# Patient Record
Sex: Female | Born: 1985 | Hispanic: No | Marital: Married | Smoking: Former smoker
Health system: Southern US, Community
[De-identification: ages and names within clinical notes are randomized; demographics above are authoritative.]

## PROBLEM LIST (undated history)

## (undated) DIAGNOSIS — Z789 Other specified health status: Secondary | ICD-10-CM

## (undated) DIAGNOSIS — F988 Other specified behavioral and emotional disorders with onset usually occurring in childhood and adolescence: Secondary | ICD-10-CM

## (undated) DIAGNOSIS — D649 Anemia, unspecified: Secondary | ICD-10-CM

## (undated) HISTORY — DX: Anemia, unspecified: D64.9

## (undated) HISTORY — PX: NO PAST SURGERIES: SHX2092

## (undated) HISTORY — DX: Other specified behavioral and emotional disorders with onset usually occurring in childhood and adolescence: F98.8

---
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
## 2008-01-07 ENCOUNTER — Other Ambulatory Visit: Admission: RE | Admit: 2008-01-07 | Discharge: 2008-01-07 | Payer: Self-pay | Admitting: Obstetrics and Gynecology

## 2009-10-24 ENCOUNTER — Other Ambulatory Visit: Admission: RE | Admit: 2009-10-24 | Discharge: 2009-10-24 | Payer: Self-pay | Admitting: Obstetrics and Gynecology

## 2010-02-04 ENCOUNTER — Ambulatory Visit: Payer: Self-pay | Admitting: Advanced Practice Midwife

## 2010-02-04 ENCOUNTER — Inpatient Hospital Stay (HOSPITAL_COMMUNITY): Admission: AD | Admit: 2010-02-04 | Discharge: 2010-02-06 | Payer: Self-pay | Admitting: Obstetrics and Gynecology

## 2010-11-20 LAB — RAPID URINE DRUG SCREEN, HOSP PERFORMED
Benzodiazepines: NOT DETECTED
Cocaine: NOT DETECTED

## 2010-11-20 LAB — CBC
HCT: 30.3 % — ABNORMAL LOW (ref 36.0–46.0)
Hemoglobin: 10.2 g/dL — ABNORMAL LOW (ref 12.0–15.0)
MCV: 75 fL — ABNORMAL LOW (ref 78.0–100.0)
WBC: 14.8 10*3/uL — ABNORMAL HIGH (ref 4.0–10.5)

## 2010-11-20 LAB — RPR: RPR Ser Ql: NONREACTIVE

## 2013-09-03 NOTE — L&D Delivery Note (Signed)
Delivery Note At 9:42 PM a viable female was delivered via Vaginal, Spontaneous Delivery (Presentation: Right Occiput Anterior) through loose nuchal x 1- delivered through and reduced immediately after birth. APGAR: pending at time of note; infant vigorous w/ lusty cry. Infant placed directly on mom's abdomen for bonding/skin-to-skin. Cord allowed to stop pulsing, and was clamped x 2, and cut by pt's sister.  Weight: pending  .   Placenta status: Intact, Spontaneous.  Cord: 3 vessels with the following complications: None.    Anesthesia: Epidural  Episiotomy: None Lacerations: None Suture Repair: n/a Est. Blood Loss (mL):  156ml  Mom to postpartum.  Baby to Couplet care / Skin to Skin. Plans to bottlefeed, probably nexplanon for contraception, circ at FT  Tawnya Crook 08/21/2014, 9:59 PM

## 2014-01-15 ENCOUNTER — Inpatient Hospital Stay (HOSPITAL_COMMUNITY)
Admission: AD | Admit: 2014-01-15 | Discharge: 2014-01-16 | Disposition: A | Discharge status: Home / Self Care | Payer: Medicaid Other | Source: Ambulatory Visit | Attending: Obstetrics and Gynecology | Admitting: Obstetrics and Gynecology

## 2014-01-15 ENCOUNTER — Encounter (HOSPITAL_COMMUNITY): Payer: Self-pay | Admitting: *Deleted

## 2014-01-15 DIAGNOSIS — O418X10 Other specified disorders of amniotic fluid and membranes, first trimester, not applicable or unspecified: Secondary | ICD-10-CM

## 2014-01-15 DIAGNOSIS — Z833 Family history of diabetes mellitus: Secondary | ICD-10-CM | POA: Insufficient documentation

## 2014-01-15 DIAGNOSIS — M545 Low back pain, unspecified: Secondary | ICD-10-CM | POA: Insufficient documentation

## 2014-01-15 DIAGNOSIS — O2390 Unspecified genitourinary tract infection in pregnancy, unspecified trimester: Secondary | ICD-10-CM | POA: Insufficient documentation

## 2014-01-15 DIAGNOSIS — O468X1 Other antepartum hemorrhage, first trimester: Secondary | ICD-10-CM

## 2014-01-15 DIAGNOSIS — O208 Other hemorrhage in early pregnancy: Secondary | ICD-10-CM | POA: Diagnosis not present

## 2014-01-15 DIAGNOSIS — N390 Urinary tract infection, site not specified: Secondary | ICD-10-CM

## 2014-01-15 DIAGNOSIS — O99891 Other specified diseases and conditions complicating pregnancy: Secondary | ICD-10-CM | POA: Diagnosis not present

## 2014-01-15 DIAGNOSIS — O26899 Other specified pregnancy related conditions, unspecified trimester: Secondary | ICD-10-CM

## 2014-01-15 DIAGNOSIS — R109 Unspecified abdominal pain: Secondary | ICD-10-CM | POA: Diagnosis present

## 2014-01-15 DIAGNOSIS — O9989 Other specified diseases and conditions complicating pregnancy, childbirth and the puerperium: Secondary | ICD-10-CM

## 2014-01-15 HISTORY — DX: Other specified health status: Z78.9

## 2014-01-15 LAB — URINALYSIS, ROUTINE W REFLEX MICROSCOPIC
Bilirubin Urine: NEGATIVE
Glucose, UA: NEGATIVE mg/dL
Hgb urine dipstick: NEGATIVE
Ketones, ur: NEGATIVE mg/dL
NITRITE: POSITIVE — AB
PH: 6 (ref 5.0–8.0)
Protein, ur: NEGATIVE mg/dL
SPECIFIC GRAVITY, URINE: 1.025 (ref 1.005–1.030)
Urobilinogen, UA: 1 mg/dL (ref 0.0–1.0)

## 2014-01-15 LAB — CBC
HEMATOCRIT: 35.1 % — AB (ref 36.0–46.0)
Hemoglobin: 11.6 g/dL — ABNORMAL LOW (ref 12.0–15.0)
MCH: 27.6 pg (ref 26.0–34.0)
MCHC: 33 g/dL (ref 30.0–36.0)
MCV: 83.6 fL (ref 78.0–100.0)
Platelets: 247 10*3/uL (ref 150–400)
RBC: 4.2 MIL/uL (ref 3.87–5.11)
RDW: 13.3 % (ref 11.5–15.5)
WBC: 6.6 10*3/uL (ref 4.0–10.5)

## 2014-01-15 LAB — WET PREP, GENITAL
Trich, Wet Prep: NONE SEEN
Yeast Wet Prep HPF POC: NONE SEEN

## 2014-01-15 LAB — URINE MICROSCOPIC-ADD ON

## 2014-01-15 LAB — HCG, QUANTITATIVE, PREGNANCY: hCG, Beta Chain, Quant, S: 49353 m[IU]/mL — ABNORMAL HIGH (ref <5)

## 2014-01-15 LAB — POCT PREGNANCY, URINE: Preg Test, Ur: POSITIVE — AB

## 2014-01-15 MED ORDER — FAMOTIDINE IN NACL 20-0.9 MG/50ML-% IV SOLN
20.0000 mg | Freq: Once | INTRAVENOUS | Status: AC
  Administered 2014-01-15: 20 mg via INTRAVENOUS
  Filled 2014-01-15: qty 50

## 2014-01-15 MED ORDER — PROMETHAZINE HCL 25 MG/ML IJ SOLN
25.0000 mg | Freq: Once | INTRAVENOUS | Status: AC
  Administered 2014-01-15: 25 mg via INTRAVENOUS
  Filled 2014-01-15: qty 1

## 2014-01-15 NOTE — MAU Note (Signed)
Had a lot of vomiting for one day last wk and took preg test and was positive. Had some pain in upper abd that day. Some nausea but no further vomiting. Last couple days had sharp pains in upper abdomen and some "cold chills" coupled times last few days.

## 2014-01-15 NOTE — MAU Provider Note (Signed)
History     CSN: [id number]  Arrival date and time: 01/15/14 2126   First Provider Initiated Contact with Patient 01/15/14 2203      Chief Complaint  Patient presents with  . Abdominal Pain   HPI Ms. NYELLI SAMARA is a 28 y.o. [phone] at [redacted]w[redacted]d by LMP who presents to MAU today with complaint of 2 days of abdominal pain. The patient states that the pain started in the upper abdomen as occasional sharp pains. She states that the pain is more often after eating. It has become more frequent. She is not having any pain right now. She is now also having lower abdominal pains occasionally. She denies fever, but has had chills. She is also having mild low back pain. She denies vaginal bleeding, but has had some clear, mucus discharge and noted a brown discharge last week. She has had nausea most days, but only one episode of vomiting recently. She denies UTI symptoms.    OB History   Grav Para Term Preterm Abortions TAB SAB Ect Mult Living   4 2 2       2       Past Medical History  Diagnosis Date  . Medical history non-contributory     Past Surgical History  Procedure Laterality Date  . No past surgeries      Family History  Problem Relation Age of Onset  . Diabetes Mother     History  Substance Use Topics  . Smoking status: Never Smoker   . Smokeless tobacco: Not on file  . Alcohol Use: No    Allergies: No Known Allergies  No prescriptions prior to admission    Review of Systems  Constitutional: Positive for chills. Negative for fever and malaise/fatigue.  Gastrointestinal: Positive for nausea and abdominal pain. Negative for vomiting, diarrhea and constipation.  Genitourinary: Negative for dysuria, urgency and frequency.       Neg - vaginal bleeding + discharge   Physical Exam   Blood pressure 114/67, pulse 73, temperature 98.7 F (37.1 C), resp. rate 20, height 5' (1.524 m), weight 126 lb 12.8 oz (57.516 kg), last menstrual period 11/02/2013.  Physical Exam   Constitutional: She is oriented to person, place, and time. She appears well-developed and well-nourished. No distress.  HENT:  Head: Normocephalic and atraumatic.  Cardiovascular: Normal rate, regular rhythm and normal heart sounds.   Respiratory: Effort normal and breath sounds normal. No respiratory distress.  GI: Soft. Bowel sounds are normal. She exhibits no distension and no mass. There is tenderness (mild diffuse tenderness to palpation worse in the epigastric region). There is no rebound, no guarding and no CVA tenderness.  Genitourinary: Uterus is enlarged (slightly). Uterus is not tender. Cervix exhibits no motion tenderness, no discharge and no friability. Right adnexum displays no mass and no tenderness. Left adnexum displays no mass and no tenderness. No bleeding around the vagina. Vaginal discharge (moderate amount of thin, white discharge noted) found.  Neurological: She is alert and oriented to person, place, and time.  Skin: Skin is warm and dry. No erythema.  Psychiatric: She has a normal mood and affect.   Results for orders placed during the hospital encounter of 01/15/14 (from the past 24 hour(s))  URINALYSIS, ROUTINE W REFLEX MICROSCOPIC     Status: Abnormal   Collection Time    01/15/14  9:41 PM      Result Value Ref Range   Color, Urine YELLOW  YELLOW   APPearance CLEAR  CLEAR  Specific Gravity, Urine 1.025  1.005 - 1.030   pH 6.0  5.0 - 8.0   Glucose, UA NEGATIVE  NEGATIVE mg/dL   Hgb urine dipstick NEGATIVE  NEGATIVE   Bilirubin Urine NEGATIVE  NEGATIVE   Ketones, ur NEGATIVE  NEGATIVE mg/dL   Protein, ur NEGATIVE  NEGATIVE mg/dL   Urobilinogen, UA 1.0  0.0 - 1.0 mg/dL   Nitrite POSITIVE (*) NEGATIVE   Leukocytes, UA MODERATE (*) NEGATIVE  URINE MICROSCOPIC-ADD ON     Status: Abnormal   Collection Time    01/15/14  9:41 PM      Result Value Ref Range   Squamous Epithelial / LPF FEW (*) RARE   WBC, UA 7-10  <3 WBC/hpf   RBC / HPF 0-2  <3 RBC/hpf    Bacteria, UA MANY (*) RARE  POCT PREGNANCY, URINE     Status: Abnormal   Collection Time    01/15/14  9:55 PM      Result Value Ref Range   Preg Test, Ur POSITIVE (*) NEGATIVE  CBC     Status: Abnormal   Collection Time    01/15/14 10:35 PM      Result Value Ref Range   WBC 6.6  4.0 - 10.5 K/uL   RBC 4.20  3.87 - 5.11 MIL/uL   Hemoglobin 11.6 (*) 12.0 - 15.0 g/dL   HCT 35.1 (*) 36.0 - 46.0 %   MCV 83.6  78.0 - 100.0 fL   MCH 27.6  26.0 - 34.0 pg   MCHC 33.0  30.0 - 36.0 g/dL   RDW 13.3  11.5 - 15.5 %   Platelets 247  150 - 400 K/uL  ABO/RH     Status: None   Collection Time    01/15/14 10:35 PM      Result Value Ref Range   ABO/RH(D) O POS    HCG, QUANTITATIVE, PREGNANCY     Status: Abnormal   Collection Time    01/15/14 10:35 PM      Result Value Ref Range   hCG, Beta Chain, Quant, S [id number] (*) <5 mIU/mL  WET PREP, GENITAL     Status: Abnormal   Collection Time    01/15/14 11:10 PM      Result Value Ref Range   Yeast Wet Prep HPF POC NONE SEEN  NONE SEEN   Trich, Wet Prep NONE SEEN  NONE SEEN   Clue Cells Wet Prep HPF POC FEW (*) NONE SEEN   WBC, Wet Prep HPF POC MODERATE (*) NONE SEEN   US Ob Comp Less 14 Wks  01/16/2014   CLINICAL DATA:  Lower abdominal pain and vaginal spotting. Ten weeks and 5 days pregnant by last menstrual period. Quantitative beta HCG [id number].  EXAM: OBSTETRIC <14 WK [place] AND TRANSVAGINAL OB US  TECHNIQUE: Both transabdominal and transvaginal ultrasound examinations were performed for complete evaluation of the gestation as well as the maternal uterus, adnexal regions, and pelvic cul-de-sac. Transvaginal technique was performed to assess early pregnancy.  COMPARISON:  None.  FINDINGS: Intrauterine gestational sac: Visualized/normal in shape.  Yolk sac:  Visualized/normal in shape  Embryo:  Visualized  Cardiac Activity: Visualized  Heart Rate:  154 bpm  CRL:   10.2  mm   7 w 1 d                  [place] EDC: 09/03/2014  Maternal uterus/adnexae: Small subchorionic  hemorrhage. Normal appearing maternal ovaries with a left corpus luteum. No free  peritoneal fluid.  IMPRESSION: 1. Single live intrauterine gestation with an estimated gestational age of [redacted] weeks 1 day. 2. Small subchorionic hemorrhage.   Electronically Signed   By: Enrique Sack M.D.   On: 01/16/2014 02:02   US Ob Transvaginal  01/16/2014   CLINICAL DATA:  Lower abdominal pain and vaginal spotting. Ten weeks and 5 days pregnant by last menstrual period. Quantitative beta HCG [id number].  EXAM: OBSTETRIC <14 WK [place] AND TRANSVAGINAL OB US  TECHNIQUE: Both transabdominal and transvaginal ultrasound examinations were performed for complete evaluation of the gestation as well as the maternal uterus, adnexal regions, and pelvic cul-de-sac. Transvaginal technique was performed to assess early pregnancy.  COMPARISON:  None.  FINDINGS: Intrauterine gestational sac: Visualized/normal in shape.  Yolk sac:  Visualized/normal in shape  Embryo:  Visualized  Cardiac Activity: Visualized  Heart Rate:  154 bpm  CRL:   10.2  mm   7 w 1 d                  [place] EDC: 09/03/2014  Maternal uterus/adnexae: Small subchorionic hemorrhage. Normal appearing maternal ovaries with a left corpus luteum. No free peritoneal fluid.  IMPRESSION: 1. Single live intrauterine gestation with an estimated gestational age of [redacted] weeks 1 day. 2. Small subchorionic hemorrhage.   Electronically Signed   By: Enrique Sack M.D.   On: 01/16/2014 02:02    MAU Course  Procedures None  MDM +UPT UA, wet prep, GC/Chlamydia, CBC, ABO/Rh, quant hCG and [place] today Pepcid given in MAU  Assessment and Plan  A: [facility] at [redacted]w[redacted]d with normal cardiac activity UTI Small subchorionic hemorrhage  P: Discharge home Rx for Phenergan and Keflex given to patient Warning signs for pyelonephritis discussed Discussed use of Unisom and B6 for N/V First trimester warning signs discussed Patient advised to keep appointment with [facility] for routine prenatal care Patient may return to  MAU as needed or if her condition were to change or worsen  Farris Has, PA-C  01/16/2014, 3:33 AM

## 2014-01-16 ENCOUNTER — Inpatient Hospital Stay (HOSPITAL_COMMUNITY): Payer: Medicaid Other

## 2014-01-16 DIAGNOSIS — N390 Urinary tract infection, site not specified: Secondary | ICD-10-CM

## 2014-01-16 LAB — ABO/RH: ABO/RH(D): O POS

## 2014-01-16 MED ORDER — PROMETHAZINE HCL 12.5 MG PO TABS: 12.5000 mg | ORAL_TABLET | Freq: Four times a day (QID) | ORAL | Status: DC | PRN

## 2014-01-16 MED ORDER — CEPHALEXIN 500 MG PO CAPS: 500.0000 mg | ORAL_CAPSULE | Freq: Four times a day (QID) | ORAL | Status: DC

## 2014-01-16 NOTE — Discharge Instructions (Signed)
Subchorionic Hematoma A subchorionic hematoma is a gathering of blood between the outer wall of the placenta and the inner wall of the womb (uterus). The placenta is the organ that connects the fetus to the wall of the uterus. The placenta performs the feeding, breathing (oxygen to the fetus), and waste removal (excretory work) of the fetus.  Subchorionic hematoma is the most common abnormality found on a result from ultrasonography done during the first trimester or early second trimester of pregnancy. If there has been little or no vaginal bleeding, early small hematomas usually shrink on their own and do not affect your baby or pregnancy. The blood is gradually absorbed over 1 2 weeks. When bleeding starts later in pregnancy or the hematoma is larger or occurs in an older pregnant woman, the outcome may not be as good. Larger hematomas may get bigger, which increases the chances for miscarriage. Subchorionic hematoma also increases the risk of premature detachment of the placenta from the uterus, preterm (premature) labor, and stillbirth. HOME CARE INSTRUCTIONS   Stay on bed rest if your health care provider recommends this. Although bed rest will not prevent more bleeding or prevent a miscarriage, your health care provider may recommend bed rest until you are advised otherwise.  Avoid heavy lifting (more than 10 lb [4.5 kg]), exercise, sexual intercourse, or douching as directed by your health care provider.  Keep track of the number of pads you use each day and how soaked (saturated) they are. Write down this information.  Do not use tampons.  Keep all follow-up appointments as directed by your health care provider. Your health care provider may ask you to have follow-up blood tests or ultrasound tests or both. SEEK IMMEDIATE MEDICAL CARE IF:   You have severe cramps in your stomach, back, abdomen, or pelvis.  You have a fever.  You pass large clots or tissue. Save any tissue for your health  care provider to look at.  Your bleeding increases or you become lightheaded, feel weak, or have fainting episodes. Document Released: 12/05/2006 Document Revised: 06/10/2013 Document Reviewed: 03/19/2013 [facility] Patient Information 2014 [place], [place]. Urinary Tract Infection A urinary tract infection (UTI) can occur any place along the urinary tract. The tract includes the kidneys, ureters, bladder, and urethra. A type of germ called bacteria often causes a UTI. UTIs are often helped with antibiotic medicine.  HOME CARE   If given, take antibiotics as told by your doctor. Finish them even if you start to feel better.  Drink enough fluids to keep your pee (urine) clear or pale yellow.  Avoid tea, drinks with caffeine, and bubbly (carbonated) drinks.  Pee often. Avoid holding your pee in for a long time.  Pee before and after having sex (intercourse).  Wipe from front to back after you poop (bowel movement) if you are a woman. Use each tissue only once. GET HELP RIGHT AWAY IF:   You have back pain.  You have lower belly (abdominal) pain.  You have chills.  You feel sick to your stomach (nauseous).  You throw up (vomit).  Your burning or discomfort with peeing does not go away.  You have a fever.  Your symptoms are not better in 3 days. MAKE SURE YOU:   Understand these instructions.  Will watch your condition.  Will get help right away if you are not doing well or get worse. Document Released: 02/06/2008 Document Revised: 05/14/2012 Document Reviewed: 03/20/2012 [facility] Patient Information 2014 [place], [place].

## 2014-01-16 NOTE — Progress Notes (Signed)
Faith Allen Either PA in earlier to discuss test results and d/c plan. Written and verbal d/c instructions given and understanding voiced

## 2014-01-17 NOTE — MAU Provider Note (Signed)
Attestation of Attending Supervision of Advanced Practitioner: Evaluation and management procedures were performed by the PA/NP/CNM/OB Fellow under my supervision/collaboration. Chart reviewed and agree with management and plan.  Jonnie Kind 01/17/2014 7:05 PM

## 2014-01-27 ENCOUNTER — Other Ambulatory Visit: Payer: Self-pay | Admitting: Obstetrics and Gynecology

## 2014-01-27 DIAGNOSIS — O3680X0 Pregnancy with inconclusive fetal viability, not applicable or unspecified: Secondary | ICD-10-CM

## 2014-01-29 ENCOUNTER — Other Ambulatory Visit: Payer: Self-pay | Admitting: Obstetrics and Gynecology

## 2014-01-29 ENCOUNTER — Ambulatory Visit (INDEPENDENT_AMBULATORY_CARE_PROVIDER_SITE_OTHER): Payer: Medicaid Other

## 2014-01-29 ENCOUNTER — Other Ambulatory Visit: Payer: Self-pay | Admitting: *Deleted

## 2014-01-29 DIAGNOSIS — O368990 Maternal care for other specified fetal problems, unspecified trimester, not applicable or unspecified: Secondary | ICD-10-CM

## 2014-01-29 DIAGNOSIS — O3680X0 Pregnancy with inconclusive fetal viability, not applicable or unspecified: Secondary | ICD-10-CM

## 2014-01-29 DIAGNOSIS — O43899 Other placental disorders, unspecified trimester: Secondary | ICD-10-CM

## 2014-01-29 MED ORDER — DOXYLAMINE-PYRIDOXINE 10-10 MG PO TBEC
10.0000 mg | DELAYED_RELEASE_TABLET | ORAL | Status: DC
Start: 2014-01-29 — End: 2014-03-03

## 2014-01-29 NOTE — Progress Notes (Signed)
U/S(9+0wks)-single IUP with +FCA noted, FHR-176bpm, cx appears closed (3.4cm), bilateral adnexa appears wnl, CRL c/w dates, small [facility] remains (11 x 85mm)

## 2014-02-04 ENCOUNTER — Encounter: Payer: Medicaid Other | Admitting: Advanced Practice Midwife

## 2014-02-08 ENCOUNTER — Encounter: Payer: Medicaid Other | Admitting: Women's Health

## 2014-02-16 ENCOUNTER — Encounter: Payer: Self-pay | Admitting: Women's Health

## 2014-02-24 ENCOUNTER — Encounter: Payer: Self-pay | Admitting: Women's Health

## 2014-03-03 ENCOUNTER — Encounter: Payer: Self-pay | Admitting: Women's Health

## 2014-03-03 ENCOUNTER — Other Ambulatory Visit (HOSPITAL_COMMUNITY)
Admission: RE | Admit: 2014-03-03 | Discharge: 2014-03-03 | Disposition: A | Discharge status: Home / Self Care | Payer: Medicaid Other | Source: Ambulatory Visit | Attending: Advanced Practice Midwife | Admitting: Advanced Practice Midwife

## 2014-03-03 ENCOUNTER — Ambulatory Visit (INDEPENDENT_AMBULATORY_CARE_PROVIDER_SITE_OTHER): Payer: Medicaid Other

## 2014-03-03 ENCOUNTER — Ambulatory Visit (INDEPENDENT_AMBULATORY_CARE_PROVIDER_SITE_OTHER): Payer: Medicaid Other | Admitting: Women's Health

## 2014-03-03 ENCOUNTER — Other Ambulatory Visit: Payer: Self-pay | Admitting: Women's Health

## 2014-03-03 VITALS — BP 90/62 | Wt 117.0 lb

## 2014-03-03 DIAGNOSIS — D259 Leiomyoma of uterus, unspecified: Secondary | ICD-10-CM | POA: Insufficient documentation

## 2014-03-03 DIAGNOSIS — Z3482 Encounter for supervision of other normal pregnancy, second trimester: Secondary | ICD-10-CM

## 2014-03-03 DIAGNOSIS — Z01419 Encounter for gynecological examination (general) (routine) without abnormal findings: Secondary | ICD-10-CM | POA: Diagnosis not present

## 2014-03-03 DIAGNOSIS — Z36 Encounter for antenatal screening of mother: Secondary | ICD-10-CM

## 2014-03-03 DIAGNOSIS — Z3481 Encounter for supervision of other normal pregnancy, first trimester: Secondary | ICD-10-CM

## 2014-03-03 DIAGNOSIS — Z3480 Encounter for supervision of other normal pregnancy, unspecified trimester: Secondary | ICD-10-CM

## 2014-03-03 DIAGNOSIS — O3410 Maternal care for benign tumor of corpus uteri, unspecified trimester: Secondary | ICD-10-CM

## 2014-03-03 DIAGNOSIS — Z1389 Encounter for screening for other disorder: Secondary | ICD-10-CM

## 2014-03-03 DIAGNOSIS — Z113 Encounter for screening for infections with a predominantly sexual mode of transmission: Secondary | ICD-10-CM | POA: Diagnosis present

## 2014-03-03 DIAGNOSIS — Z331 Pregnant state, incidental: Secondary | ICD-10-CM

## 2014-03-03 LAB — CBC
HEMATOCRIT: 35.1 % — AB (ref 36.0–46.0)
HEMOGLOBIN: 12 g/dL (ref 12.0–15.0)
MCH: 27.9 pg (ref 26.0–34.0)
MCHC: 34.2 g/dL (ref 30.0–36.0)
MCV: 81.6 fL (ref 78.0–100.0)
Platelets: 231 10*3/uL (ref 150–400)
RBC: 4.3 MIL/uL (ref 3.87–5.11)
RDW: 14.4 % (ref 11.5–15.5)
WBC: 5.5 10*3/uL (ref 4.0–10.5)

## 2014-03-03 NOTE — Patient Instructions (Addendum)
Nausea & Vomiting  Have saltine crackers or pretzels by your bed and eat a few bites before you raise your head out of bed in the morning  Eat small frequent meals throughout the day instead of large meals  Drink plenty of fluids throughout the day to stay hydrated, just don't drink a lot of fluids with your meals.  This can make your stomach fill up faster making you feel sick  Do not brush your teeth right after you eat  Products with real ginger are good for nausea, like ginger ale and ginger hard candy Make sure it says made with real ginger!  Sucking on sour candy like lemon heads is also good for nausea  If your prenatal vitamins make you nauseated, take them at night so you will sleep through the nausea  You can also try sea bands (bracelets that go on your wrist) that you can purchase to help with nausea  If you feel like you need medicine for the nausea & vomiting please let us know  If you are unable to keep any fluids or food down please let us know or go to [facility] to get checked out   Constipation  Drink plenty of fluid, preferably water, throughout the day  Eat foods high in fiber such as fruits, vegetables, and grains  Exercise, such as walking, is a good way to keep your bowels regular  Drink warm fluids, especially warm prune juice, or decaf coffee  Eat a 1/2 cup of real oatmeal (not instant), 1/2 cup applesauce, and 1/2-1 cup warm prune juice every day  If needed, you may take Colace (docusate sodium) stool softener once or twice a day to help keep the stool soft. If you are pregnant, wait until you are out of your first trimester (12-14 weeks of pregnancy)  If you still are having problems with constipation, you may take Miralax once daily as needed to help keep your bowels regular.  If you are pregnant, wait until you are out of your first trimester (12-14 weeks of pregnancy)  Pregnancy - First Trimester During sexual intercourse, millions of sperm  go into the vagina. Only 1 sperm will penetrate and fertilize the female egg while it is in the Fallopian tube. One week later, the fertilized egg implants into the wall of the uterus. An embryo begins to develop into a baby. At 6 to 8 weeks, the eyes and face are formed and the heartbeat can be seen on ultrasound. At the end of 12 weeks (first trimester), all the baby's organs are formed. Now that you are pregnant, you will want to do everything you can to have a healthy baby. Two of the most important things are to get good prenatal care and follow your caregiver's instructions. Prenatal care is all the medical care you receive before the baby's birth. It is given to prevent, find, and treat problems during the pregnancy and childbirth. PRENATAL EXAMS  During prenatal visits, your weight, blood pressure, and urine are checked. This is done to make sure you are healthy and progressing normally during the pregnancy.  A pregnant woman should gain 25 to 35 pounds during the pregnancy. However, if you are overweight or underweight, your caregiver will advise you regarding your weight.  Your caregiver will ask and answer questions for you.  Blood work, cervical cultures, other necessary tests, and a Pap test are done during your prenatal exams. These tests are done to check on your health and the probable  health of your baby. Tests are strongly recommended and done for HIV with your permission. This is the virus that causes AIDS. These tests are done because medicines can be given to help prevent your baby from being born with this infection should you have been infected without knowing it. Blood work is also used to find out your blood type, previous infections, and follow your blood levels (hemoglobin).  Low hemoglobin (anemia) is common during pregnancy. Iron and vitamins are given to help prevent this. Later in the pregnancy, blood tests for diabetes will be done along with any other tests if any problems  develop.  You may need other tests to make sure you and the baby are doing well. CHANGES DURING THE FIRST TRIMESTER  Your body goes through many changes during pregnancy. They vary from person to person. Talk to your caregiver about changes you notice and are concerned about. Changes can include:  Your menstrual period stops.  The egg and sperm carry the genes that determine what you look like. Genes from you and your partner are forming a baby. The female genes determine whether the baby is a boy or a girl.  Your body increases in girth and you may feel bloated.  Feeling sick to your stomach (nauseous) and throwing up (vomiting). If the vomiting is uncontrollable, call your caregiver.  Your breasts will begin to enlarge and become tender.  Your nipples may stick out more and become darker.  The need to urinate more. Painful urination may mean you have a bladder infection.  Tiring easily.  Loss of appetite.  Cravings for certain kinds of food.  At first, you may gain or lose a couple of pounds.  You may have changes in your emotions from day to day (excited to be pregnant or concerned something may go wrong with the pregnancy and baby).  You may have more vivid and strange dreams. HOME CARE INSTRUCTIONS   It is very important to avoid all smoking, alcohol and non-prescribed drugs during your pregnancy. These affect the formation and growth of the baby. Avoid chemicals while pregnant to ensure the delivery of a healthy infant.  Start your prenatal visits by the 12th week of pregnancy. They are usually scheduled monthly at first, then more often in the last 2 months before delivery. Keep your caregiver's appointments. Follow your caregiver's instructions regarding medicine use, blood and lab tests, exercise, and diet.  During pregnancy, you are providing food for you and your baby. Eat regular, well-balanced meals. Choose foods such as meat, fish, milk and other low fat dairy  products, vegetables, fruits, and whole-grain breads and cereals. Your caregiver will tell you of the ideal weight gain.  You can help morning sickness by keeping soda crackers at the bedside. Eat a couple before arising in the morning. You may want to use the crackers without salt on them.  Eating 4 to 5 small meals rather than 3 large meals a day also may help the nausea and vomiting.  Drinking liquids between meals instead of during meals also seems to help nausea and vomiting.  A physical sexual relationship may be continued throughout pregnancy if there are no other problems. Problems may be early (premature) leaking of amniotic fluid from the membranes, vaginal bleeding, or belly (abdominal) pain.  Exercise regularly if there are no restrictions. Check with your caregiver or physical therapist if you are unsure of the safety of some of your exercises. Greater weight gain will occur in the last 2  trimesters of pregnancy. Exercising will help:  Control your weight.  Keep you in shape.  Prepare you for labor and delivery.  Help you lose your pregnancy weight after you deliver your baby.  Wear a good support or jogging bra for breast tenderness during pregnancy. This may help if worn during sleep too.  Ask when prenatal classes are available. Begin classes when they are offered.  Do not use hot tubs, steam rooms, or saunas.  Wear your seat belt when driving. This protects you and your baby if you are in an accident.  Avoid raw meat, uncooked cheese, cat litter boxes, and soil used by cats throughout the pregnancy. These carry germs that can cause birth defects in the baby.  The first trimester is a good time to visit your dentist for your dental health. Getting your teeth cleaned is okay. Use a softer toothbrush and brush gently during pregnancy.  Ask for help if you have financial, counseling, or nutritional needs during pregnancy. Your caregiver will be able to offer counseling  for these needs as well as refer you for other special needs.  Do not take any medicines or herbs unless told by your caregiver.  Inform your caregiver if there is any mental or physical domestic violence.  Make a list of emergency phone numbers of family, friends, hospital, and police and fire departments.  Write down your questions. Take them to your prenatal visit.  Do not douche.  Do not cross your legs.  If you have to stand for long periods of time, rotate you feet or take small steps in a circle.  You may have more vaginal secretions that may require a sanitary pad. Do not use tampons or scented sanitary pads. MEDICINES AND DRUG USE IN PREGNANCY  Take prenatal vitamins as directed. The vitamin should contain 1 milligram of folic acid. Keep all vitamins out of reach of children. Only a couple vitamins or tablets containing iron may be fatal to a baby or young child when ingested.  Avoid use of all medicines, including herbs, over-the-counter medicines, not prescribed or suggested by your caregiver. Only take over-the-counter or prescription medicines for pain, discomfort, or fever as directed by your caregiver. Do not use aspirin, ibuprofen, or naproxen unless directed by your caregiver.  Let your caregiver also know about herbs you may be using.  Alcohol is related to a number of birth defects. This includes fetal alcohol syndrome. All alcohol, in any form, should be avoided completely. Smoking will cause low birth rate and premature babies.  Street or illegal drugs are very harmful to the baby. They are absolutely forbidden. A baby born to an addicted mother will be addicted at birth. The baby will go through the same withdrawal an adult does.  Let your caregiver know about any medicines that you have to take and for what reason you take them. SEEK MEDICAL CARE IF:  You have any concerns or worries during your pregnancy. It is better to call with your questions if you feel they  cannot wait, rather than worry about them. SEEK IMMEDIATE MEDICAL CARE IF:   An unexplained oral temperature above 102 F (38.9 C) develops, or as your caregiver suggests.  You have leaking of fluid from the vagina (birth canal). If leaking membranes are suspected, take your temperature and inform your caregiver of this when you call.  There is vaginal spotting or bleeding. Notify your caregiver of the amount and how many pads are used.  You develop a  bad smelling vaginal discharge with a change in the color.  You continue to feel sick to your stomach (nauseated) and have no relief from remedies suggested. You vomit blood or coffee ground-like materials.  You lose more than 2 pounds of weight in 1 week.  You gain more than 2 pounds of weight in 1 week and you notice swelling of your face, hands, feet, or legs.  You gain 5 pounds or more in 1 week (even if you do not have swelling of your hands, face, legs, or feet).  You get exposed to [place] measles and have never had them.  You are exposed to fifth disease or chickenpox.  You develop belly (abdominal) pain. Round ligament discomfort is a common non-cancerous (benign) cause of abdominal pain in pregnancy. Your caregiver still must evaluate this.  You develop headache, fever, diarrhea, pain with urination, or shortness of breath.  You fall or are in a car accident or have any kind of trauma.  There is mental or physical violence in your home. Document Released: 08/14/2001 Document Revised: 05/14/2012 Document Reviewed: 06/30/2013 [facility] Patient Information 2015 [place], [place]. This information is not intended to replace advice given to you by your health care provider. Make sure you discuss any questions you have with your health care provider.    Uterine Fibroid A uterine fibroid is a growth (tumor) that occurs in your uterus. This type of tumor is not cancerous and does not spread out of the uterus. You can have one or many  fibroids. Fibroids can vary in size, weight, and where they grow in the uterus. Some can become quite large. Most fibroids do not require medical treatment, but some can cause pain or heavy bleeding during and between periods. CAUSES  A fibroid is the result of a single uterine cell that keeps growing (unregulated), which is different than most cells in the human body. Most cells have a control mechanism that keeps them from reproducing without control.  SIGNS AND SYMPTOMS   Bleeding.  Pelvic pain and pressure.  Bladder problems due to the size of the fibroid.  Infertility and miscarriages depending on the size and location of the fibroid. DIAGNOSIS  Uterine fibroids are diagnosed through a physical exam. Your health care provider may feel the lumpy tumors during a pelvic exam. Ultrasonography may be done to get information regarding size, location, and number of tumors.  TREATMENT   Your health care provider may recommend watchful waiting. This involves getting the fibroid checked by your health care provider to see if it grows or shrinks.   Hormone treatment or an intrauterine device (IUD) may be prescribed.   Surgery may be needed to remove the fibroids (myomectomy) or the uterus (hysterectomy). This depends on your situation. When fibroids interfere with fertility and a woman wants to become pregnant, a health care provider may recommend having the fibroids removed.  [place] care depends on how you were treated. In general:   Keep all follow-up appointments with your health care provider.   Only take over-the-counter or prescription medicines as directed by your health care provider. If you were prescribed a hormone treatment, take the hormone medicines exactly as directed. Do not take aspirin. It can cause bleeding.   Talk to your health care provider about taking iron pills.  If your periods are troublesome but not so heavy, lie down with your feet raised  slightly above your heart. Place cold packs on your lower abdomen.   If  your periods are heavy, write down the number of pads or tampons you use per month. Bring this information to your health care provider.   Include green vegetables in your diet.  SEEK IMMEDIATE MEDICAL CARE IF:  You have pelvic pain or cramps not controlled with medicines.   You have a sudden increase in pelvic pain.   You have an increase in bleeding between and during periods.   You have excessive periods and soak tampons or pads in a half hour or less.  You feel lightheaded or have fainting episodes. Document Released: 08/17/2000 Document Revised: 06/10/2013 Document Reviewed: 03/19/2013 [facility] Patient Information 2015 [place], [place]. This information is not intended to replace advice given to you by your health care provider. Make sure you discuss any questions you have with your health care provider.

## 2014-03-03 NOTE — Progress Notes (Signed)
  Subjective:  Faith Allen is a 28 y.o. G17P2002 Hispanic female at [redacted]w[redacted]d by 7.1wk u/s, being seen today for her first obstetrical visit.  Her obstetrical history is significant for term uncomplicated svd x 2.  Pregnancy history fully reviewed.  Patient reports n/v- diclegis made her drowsy, doesn't want any other meds. Denies vb, cramping, uti s/s, abnormal/malodorous vag d/c, or vulvovaginal itching/irritation.  BP 90/62  Wt 117 lb (53.071 kg)  LMP 11/02/2013  HISTORY: OB History  Gravida Para Term Preterm AB SAB TAB Ectopic Multiple Living  3 2 2       2     # Outcome Date GA Lbr Len/2nd Weight Sex Delivery Anes PTL Lv  3 CUR           2 TRM 02/04/10    F SVD   Y  1 TRM 06/19/08    M SVD   Y     Past Medical History  Diagnosis Date  . Medical history non-contributory    Past Surgical History  Procedure Laterality Date  . No past surgeries     Family History  Problem Relation Age of Onset  . Diabetes Mother   . Miscarriages / Stillbirths Sister   . Irritable bowel syndrome Sister     Exam   System:     General: Well developed & nourished, no acute distress   Skin: Warm & dry, normal coloration and turgor, no rashes   Neurologic: Alert & oriented, normal mood   Cardiovascular: Regular rate & rhythm   Respiratory: Effort & rate normal, LCTAB, acyanotic   Abdomen: Soft, non tender   Extremities: normal strength, tone   Pelvic Exam:    Perineum: Normal perineum   Vulva: Normal, no lesions   Vagina:  Normal mucosa, normal discharge   Cervix: Normal, bulbous, appears closed   Uterus: Normal size/shape/contour for GA   Thin prep pap smear obtained w/ reflex high risk HPV  FHR: 140 via u/s   Assessment:   Pregnancy: G3P2002 There are no active problems to display for this patient.   [redacted]w[redacted]d G3P2002 New OB visit N/V of pregnancy    Plan:  Initial labs drawn Continue prenatal vitamins Problem list reviewed and updated Reviewed n/v relief measures and  warning s/s to report To go to [facility] for ivf if unable to keep food/fluids down, dehydrated Reviewed recommended weight gain based on pre-gravid BMI Encouraged well-balanced diet Genetic Screening discussed Integrated Screen: requested Cystic fibrosis screening discussed requested Ultrasound discussed; fetal survey: requested Follow up in 4 weeks for 2nd IT and visit [place] completed  Tawnya Crook CNM, [facility] 03/03/2014 3:05 PM

## 2014-03-03 NOTE — Progress Notes (Signed)
U/S(13+5wks)-single active fetus, CRL c/w dates, cx appears closed (3.2cm), bilateral adnexa appears WNL, anterior Gr 0 placenta, 2 fundal fibroids noted 2.0 & 1.8cm, NB present, NT-1.82mm, FHR-140 bpm

## 2014-03-03 NOTE — Progress Notes (Signed)
Pt states that she has had body aches, sometimes in her arms. Pt is nauseated, and constipated, Diclegis makes her sleepy.

## 2014-03-04 LAB — HIV ANTIBODY (ROUTINE TESTING W REFLEX): HIV 1&2 Ab, 4th Generation: NONREACTIVE

## 2014-03-04 LAB — HEPATITIS B SURFACE ANTIGEN: HEP B S AG: NEGATIVE

## 2014-03-04 LAB — RPR

## 2014-03-04 LAB — ANTIBODY SCREEN: Antibody Screen: NEGATIVE

## 2014-03-04 LAB — ABO AND RH: Rh Type: POSITIVE

## 2014-03-04 LAB — TSH: TSH: 0.483 u[IU]/mL (ref 0.350–4.500)

## 2014-03-04 LAB — VARICELLA ZOSTER ANTIBODY, IGG: Varicella IgG: 107.3 Index (ref <135.00)

## 2014-03-04 LAB — SICKLE CELL SCREEN: Sickle Cell Screen: NEGATIVE

## 2014-03-04 LAB — RUBELLA SCREEN: Rubella: 1 Index — ABNORMAL HIGH (ref <0.90)

## 2014-03-06 ENCOUNTER — Encounter: Payer: Self-pay | Admitting: Women's Health

## 2014-03-06 DIAGNOSIS — O09899 Supervision of other high risk pregnancies, unspecified trimester: Secondary | ICD-10-CM | POA: Insufficient documentation

## 2014-03-06 DIAGNOSIS — Z283 Underimmunization status: Secondary | ICD-10-CM

## 2014-03-06 DIAGNOSIS — Z2839 Other underimmunization status: Secondary | ICD-10-CM | POA: Insufficient documentation

## 2014-03-08 LAB — CYTOLOGY - PAP

## 2014-03-09 LAB — CYSTIC FIBROSIS DIAGNOSTIC STUDY

## 2014-03-10 LAB — MATERNAL SCREEN, INTEGRATED #1

## 2014-03-15 ENCOUNTER — Encounter: Payer: Self-pay | Admitting: Women's Health

## 2014-03-31 ENCOUNTER — Ambulatory Visit (INDEPENDENT_AMBULATORY_CARE_PROVIDER_SITE_OTHER): Payer: Medicaid Other | Admitting: Women's Health

## 2014-03-31 ENCOUNTER — Encounter: Payer: Self-pay | Admitting: Women's Health

## 2014-03-31 VITALS — BP 88/44 | Wt 122.0 lb

## 2014-03-31 DIAGNOSIS — Z331 Pregnant state, incidental: Secondary | ICD-10-CM

## 2014-03-31 DIAGNOSIS — Z3482 Encounter for supervision of other normal pregnancy, second trimester: Secondary | ICD-10-CM

## 2014-03-31 DIAGNOSIS — Z1389 Encounter for screening for other disorder: Secondary | ICD-10-CM

## 2014-03-31 DIAGNOSIS — Z3480 Encounter for supervision of other normal pregnancy, unspecified trimester: Secondary | ICD-10-CM

## 2014-03-31 LAB — POCT URINALYSIS DIPSTICK
Glucose, UA: NEGATIVE
Ketones, UA: NEGATIVE
NITRITE UA: NEGATIVE
PROTEIN UA: NEGATIVE
RBC UA: NEGATIVE

## 2014-03-31 NOTE — Patient Instructions (Signed)
Second Trimester of Pregnancy The second trimester is from week 13 through week 28, months 4 through 6. The second trimester is often a time when you feel your best. Your body has also adjusted to being pregnant, and you begin to feel better physically. Usually, morning sickness has lessened or quit completely, you may have more energy, and you may have an increase in appetite. The second trimester is also a time when the fetus is growing rapidly. At the end of the sixth month, the fetus is about 9 inches long and weighs about 1 pounds. You will likely begin to feel the baby move (quickening) between 18 and 20 weeks of the pregnancy. BODY CHANGES Your body goes through many changes during pregnancy. The changes vary from woman to woman.   Your weight will continue to increase. You will notice your lower abdomen bulging out.  You may begin to get stretch marks on your hips, abdomen, and breasts.  You may develop headaches that can be relieved by medicines approved by your health care provider.  You may urinate more often because the fetus is pressing on your bladder.  You may develop or continue to have heartburn as a result of your pregnancy.  You may develop constipation because certain hormones are causing the muscles that push waste through your intestines to slow down.  You may develop hemorrhoids or swollen, bulging veins (varicose veins).  You may have back pain because of the weight gain and pregnancy hormones relaxing your joints between the bones in your pelvis and as a result of a shift in weight and the muscles that support your balance.  Your breasts will continue to grow and be tender.  Your gums may bleed and may be sensitive to brushing and flossing.  Dark spots or blotches (chloasma, mask of pregnancy) may develop on your face. This will likely fade after the baby is born.  A dark line from your belly button to the pubic area (linea nigra) may appear. This will likely fade  after the baby is born.  You may have changes in your hair. These can include thickening of your hair, rapid growth, and changes in texture. Some women also have hair loss during or after pregnancy, or hair that feels dry or thin. Your hair will most likely return to normal after your baby is born. WHAT TO EXPECT AT YOUR PRENATAL VISITS During a routine prenatal visit:  You will be weighed to make sure you and the fetus are growing normally.  Your blood pressure will be taken.  Your abdomen will be measured to track your baby's growth.  The fetal heartbeat will be listened to.  Any test results from the previous visit will be discussed. Your health care provider may ask you:  How you are feeling.  If you are feeling the baby move.  If you have had any abnormal symptoms, such as leaking fluid, bleeding, severe headaches, or abdominal cramping.  If you have any questions. Other tests that may be performed during your second trimester include:  Blood tests that check for:  Low iron levels (anemia).  Gestational diabetes (between 24 and 28 weeks).  Rh antibodies.  Urine tests to check for infections, diabetes, or protein in the urine.  An ultrasound to confirm the proper growth and development of the baby.  An amniocentesis to check for possible genetic problems.  Fetal screens for spina bifida and Down syndrome. HOME CARE INSTRUCTIONS   Avoid all smoking, herbs, alcohol, and unprescribed   drugs. These chemicals affect the formation and growth of the baby.  Follow your health care provider's instructions regarding medicine use. There are medicines that are either safe or unsafe to take during pregnancy.  Exercise only as directed by your health care provider. Experiencing uterine cramps is a good sign to stop exercising.  Continue to eat regular, healthy meals.  Wear a good support bra for breast tenderness.  Do not use hot tubs, steam rooms, or saunas.  Wear your  seat belt at all times when driving.  Avoid raw meat, uncooked cheese, cat litter boxes, and soil used by cats. These carry germs that can cause birth defects in the baby.  Take your prenatal vitamins.  Try taking a stool softener (if your health care provider approves) if you develop constipation. Eat more high-fiber foods, such as fresh vegetables or fruit and whole grains. Drink plenty of fluids to keep your urine clear or pale yellow.  Take warm sitz baths to soothe any pain or discomfort caused by hemorrhoids. Use hemorrhoid cream if your health care provider approves.  If you develop varicose veins, wear support hose. Elevate your feet for 15 minutes, 3-4 times a day. Limit salt in your diet.  Avoid heavy lifting, wear low heel shoes, and practice good posture.  Rest with your legs elevated if you have leg cramps or low back pain.  Visit your dentist if you have not gone yet during your pregnancy. Use a soft toothbrush to brush your teeth and be gentle when you floss.  A sexual relationship may be continued unless your health care provider directs you otherwise.  Continue to go to all your prenatal visits as directed by your health care provider. SEEK MEDICAL CARE IF:   You have dizziness.  You have mild pelvic cramps, pelvic pressure, or nagging pain in the abdominal area.  You have persistent nausea, vomiting, or diarrhea.  You have a bad smelling vaginal discharge.  You have pain with urination. SEEK IMMEDIATE MEDICAL CARE IF:   You have a fever.  You are leaking fluid from your vagina.  You have spotting or bleeding from your vagina.  You have severe abdominal cramping or pain.  You have rapid weight gain or loss.  You have shortness of breath with chest pain.  You notice sudden or extreme swelling of your face, hands, ankles, feet, or legs.  You have not felt your baby move in over an hour.  You have severe headaches that do not go away with  medicine.  You have vision changes. Document Released: 08/14/2001 Document Revised: 08/25/2013 Document Reviewed: 10/21/2012 [employer] Patient Information 2015 [employer]. This information is not intended to replace advice given to you by your health care provider. Make sure you discuss any questions you have with your health care provider.  

## 2014-03-31 NOTE — Progress Notes (Signed)
Low-risk OB appointment G3P2002 [redacted]w[redacted]d Estimated Date of Delivery: 09/03/14 BP 88/44  Wt 122 lb (55.339 kg)  LMP 11/02/2013  BP, weight, and urine reviewed.  Refer to obstetrical flow sheet for FH & FHR.  Reports good fm.  Denies regular uc's, lof, vb, or uti s/s. No complaints. Reviewed ptl s/s. Plan:  Continue routine obstetrical care  F/U in 3wks for OB appointment and anatomy u/s 2nd IT today

## 2014-04-06 LAB — MATERNAL SCREEN, INTEGRATED #2
AFP MOM MAT SCREEN: 0.95
AFP, SERUM MAT SCREEN: 44.4 ng/mL
Age risk Down Syndrome: 1:980 {titer}
Calculated Gestational Age: 17.6
Crown Rump Length: 78.3 mm
ESTRIOL FREE MAT SCREEN: 1.32 ng/mL
Estriol Mom: 0.98
HCG, MOM MAT SCREEN: 0.6
Inhibin A Dimeric: 161 pg/mL
Inhibin A MoM: 0.88
MSS Down Syndrome: <1:5000 {titer}
MSS Trisomy 18 Risk: <1:5000 {titer}
NT MOM MAT SCREEN: 1.07
NUCHAL TRANSLUCENCY MAT SCREEN 2: 1.84 mm
Number of fetuses: 1
PAPP-A MoM: 0.46
PAPP-A: 583 ng/mL
Rish for ONTD: <1:5000 {titer}
hCG, Serum: 21.1 IU/mL

## 2014-04-07 ENCOUNTER — Encounter: Payer: Self-pay | Admitting: Women's Health

## 2014-04-23 ENCOUNTER — Ambulatory Visit (INDEPENDENT_AMBULATORY_CARE_PROVIDER_SITE_OTHER): Payer: Medicaid Other | Admitting: Obstetrics and Gynecology

## 2014-04-23 ENCOUNTER — Ambulatory Visit (INDEPENDENT_AMBULATORY_CARE_PROVIDER_SITE_OTHER): Payer: Medicaid Other

## 2014-04-23 ENCOUNTER — Other Ambulatory Visit: Payer: Self-pay | Admitting: Women's Health

## 2014-04-23 ENCOUNTER — Encounter: Payer: Self-pay | Admitting: Obstetrics and Gynecology

## 2014-04-23 VITALS — BP 96/66 | Wt 127.0 lb

## 2014-04-23 DIAGNOSIS — Z331 Pregnant state, incidental: Secondary | ICD-10-CM

## 2014-04-23 DIAGNOSIS — Z1389 Encounter for screening for other disorder: Secondary | ICD-10-CM

## 2014-04-23 DIAGNOSIS — Z3492 Encounter for supervision of normal pregnancy, unspecified, second trimester: Secondary | ICD-10-CM

## 2014-04-23 DIAGNOSIS — Z3482 Encounter for supervision of other normal pregnancy, second trimester: Secondary | ICD-10-CM

## 2014-04-23 DIAGNOSIS — Z3480 Encounter for supervision of other normal pregnancy, unspecified trimester: Secondary | ICD-10-CM

## 2014-04-23 LAB — POCT URINALYSIS DIPSTICK
Blood, UA: NEGATIVE
Glucose, UA: NEGATIVE
KETONES UA: NEGATIVE
Leukocytes, UA: NEGATIVE
NITRITE UA: NEGATIVE
PROTEIN UA: NEGATIVE

## 2014-04-23 NOTE — Progress Notes (Signed)
[id number] [redacted]w[redacted]d Estimated Date of Delivery: 09/03/14  Blood pressure 96/66, weight 127 lb (57.607 kg), last menstrual period 11/02/2013.   refer to the ob flow sheet for FH and FHR, also BP, Wt, Urine results:negative  Patient reports good fetal movement, denies any bleeding and no rupture of membranes symptoms or regular contractions. Patient complaints: tingling in breast like she is about to leak milk.  /U/s report reviewed with patient and partner. Questions were answered. Plan:  Continued routine obstetrical care, Pros and cons of contraceptive methods discussed.   F/u in 4 weeks for PNX visit  This chart was scribed by Steva Colder, Medical Scribe, for Dr. Mallory Shirk on 8/21 at 10:00am. This chart was reviewed by Dr. Mallory Shirk for accuracy.

## 2014-04-23 NOTE — Progress Notes (Signed)
U/S(21+0wks)-single active fetus, meas c/w dates, fluid wnl, anterior Gr 0 placenta, fundal fibroids noted 2.8 & 2.1cm , FHR- 148 bpm, cx appears closed ( 3.1 cm), bilateral adnexa appears WNL, no major abnl noted

## 2014-04-23 NOTE — Progress Notes (Signed)
Pt states that she has noticed tingling in her breasts and it feels like she is about to leak milk.

## 2014-05-01 ENCOUNTER — Encounter: Payer: Self-pay | Admitting: Women's Health

## 2014-05-20 ENCOUNTER — Encounter: Payer: Medicaid Other | Admitting: Advanced Practice Midwife

## 2014-05-20 ENCOUNTER — Encounter: Payer: Self-pay | Admitting: *Deleted

## 2014-05-26 ENCOUNTER — Ambulatory Visit (INDEPENDENT_AMBULATORY_CARE_PROVIDER_SITE_OTHER): Payer: Medicaid Other | Admitting: Women's Health

## 2014-05-26 ENCOUNTER — Ambulatory Visit (INDEPENDENT_AMBULATORY_CARE_PROVIDER_SITE_OTHER): Payer: Medicaid Other

## 2014-05-26 ENCOUNTER — Encounter: Payer: Self-pay | Admitting: Women's Health

## 2014-05-26 VITALS — BP 94/60 | Wt 134.0 lb

## 2014-05-26 DIAGNOSIS — Z3480 Encounter for supervision of other normal pregnancy, unspecified trimester: Secondary | ICD-10-CM

## 2014-05-26 DIAGNOSIS — Z331 Pregnant state, incidental: Secondary | ICD-10-CM

## 2014-05-26 DIAGNOSIS — O4692 Antepartum hemorrhage, unspecified, second trimester: Secondary | ICD-10-CM

## 2014-05-26 DIAGNOSIS — O4690 Antepartum hemorrhage, unspecified, unspecified trimester: Secondary | ICD-10-CM

## 2014-05-26 DIAGNOSIS — O2390 Unspecified genitourinary tract infection in pregnancy, unspecified trimester: Secondary | ICD-10-CM

## 2014-05-26 DIAGNOSIS — Z3482 Encounter for supervision of other normal pregnancy, second trimester: Secondary | ICD-10-CM

## 2014-05-26 DIAGNOSIS — Z1389 Encounter for screening for other disorder: Secondary | ICD-10-CM

## 2014-05-26 LAB — POCT WET PREP (WET MOUNT): CLUE CELLS WET PREP WHIFF POC: NEGATIVE

## 2014-05-26 LAB — POCT URINALYSIS DIPSTICK
Glucose, UA: NEGATIVE
Ketones, UA: NEGATIVE
Leukocytes, UA: NEGATIVE
NITRITE UA: NEGATIVE
PROTEIN UA: NEGATIVE
RBC UA: NEGATIVE

## 2014-05-26 NOTE — Progress Notes (Addendum)
Low-risk OB appointment Work-in [id number] [redacted]w[redacted]d Estimated Date of Delivery: 09/03/14 BP 94/60  Wt 134 lb (60.782 kg)  LMP 11/02/2013  BP, weight, and urine reviewed.  Refer to obstetrical flow sheet for FH & FHR.  Reports good fm.  Denies regular uc's, lof, or uti s/s. Dark red vb w/ sex last night, happened about a month ago too after sex. No abnormal d/c, itching/irritation.  Last u/s @ 20wks: placenta fundal/anterior Rh+ Spec exam: cx posterior, unable to see os, mod amount creamish/tan nonodorous d/c SVE: outer os 2/inner os closed/th/-3 Wet prep: many wbc's, otherwise neg, will send gc/ct  Reviewed ptl s/s, fm. Pelvic rest for now Plan:  Continue routine obstetrical care F/U asap for u/s to assess placenta, then in 2wks for OB appointment and pn2

## 2014-05-26 NOTE — Patient Instructions (Signed)
You will have your sugar test next visit.  Please do not eat or drink anything after midnight the night before you come, not even water.  You will be here for at least two hours.     No sex until you have gone at least a week without any bleeding/spotting  Second Trimester of Pregnancy The second trimester is from week 13 through week 28, months 4 through 6. The second trimester is often a time when you feel your best. Your body has also adjusted to being pregnant, and you begin to feel better physically. Usually, morning sickness has lessened or quit completely, you may have more energy, and you may have an increase in appetite. The second trimester is also a time when the fetus is growing rapidly. At the end of the sixth month, the fetus is about 9 inches long and weighs about 1 pounds. You will likely begin to feel the baby move (quickening) between 18 and 20 weeks of the pregnancy. BODY CHANGES Your body goes through many changes during pregnancy. The changes vary from woman to woman.   Your weight will continue to increase. You will notice your lower abdomen bulging out.  You may begin to get stretch marks on your hips, abdomen, and breasts.  You may develop headaches that can be relieved by medicines approved by your health care provider.  You may urinate more often because the fetus is pressing on your bladder.  You may develop or continue to have heartburn as a result of your pregnancy.  You may develop constipation because certain hormones are causing the muscles that push waste through your intestines to slow down.  You may develop hemorrhoids or swollen, bulging veins (varicose veins).  You may have back pain because of the weight gain and pregnancy hormones relaxing your joints between the bones in your pelvis and as a result of a shift in weight and the muscles that support your balance.  Your breasts will continue to grow and be tender.  Your gums may bleed and may be  sensitive to brushing and flossing.  Dark spots or blotches (chloasma, mask of pregnancy) may develop on your face. This will likely fade after the baby is born.  A dark line from your belly button to the pubic area (linea nigra) may appear. This will likely fade after the baby is born.  You may have changes in your hair. These can include thickening of your hair, rapid growth, and changes in texture. Some women also have hair loss during or after pregnancy, or hair that feels dry or thin. Your hair will most likely return to normal after your baby is born. WHAT TO EXPECT AT YOUR PRENATAL VISITS During a routine prenatal visit:  You will be weighed to make sure you and the fetus are growing normally.  Your blood pressure will be taken.  Your abdomen will be measured to track your baby's growth.  The fetal heartbeat will be listened to.  Any test results from the previous visit will be discussed. Your health care provider may ask you:  How you are feeling.  If you are feeling the baby move.  If you have had any abnormal symptoms, such as leaking fluid, bleeding, severe headaches, or abdominal cramping.  If you have any questions. Other tests that may be performed during your second trimester include:  Blood tests that check for:  Low iron levels (anemia).  Gestational diabetes (between 24 and 28 weeks).  Rh antibodies.  Urine  tests to check for infections, diabetes, or protein in the urine.  An ultrasound to confirm the proper growth and development of the baby.  An amniocentesis to check for possible genetic problems.  Fetal screens for spina bifida and Down syndrome. HOME CARE INSTRUCTIONS   Avoid all smoking, herbs, alcohol, and unprescribed drugs. These chemicals affect the formation and growth of the baby.  Follow your health care provider's instructions regarding medicine use. There are medicines that are either safe or unsafe to take during  pregnancy.  Exercise only as directed by your health care provider. Experiencing uterine cramps is a good sign to stop exercising.  Continue to eat regular, healthy meals.  Wear a good support bra for breast tenderness.  Do not use hot tubs, steam rooms, or saunas.  Wear your seat belt at all times when driving.  Avoid raw meat, uncooked cheese, cat litter boxes, and soil used by cats. These carry germs that can cause birth defects in the baby.  Take your prenatal vitamins.  Try taking a stool softener (if your health care provider approves) if you develop constipation. Eat more high-fiber foods, such as fresh vegetables or fruit and whole grains. Drink plenty of fluids to keep your urine clear or pale yellow.  Take warm sitz baths to soothe any pain or discomfort caused by hemorrhoids. Use hemorrhoid cream if your health care provider approves.  If you develop varicose veins, wear support hose. Elevate your feet for 15 minutes, 3-4 times a day. Limit salt in your diet.  Avoid heavy lifting, wear low heel shoes, and practice good posture.  Rest with your legs elevated if you have leg cramps or low back pain.  Visit your dentist if you have not gone yet during your pregnancy. Use a soft toothbrush to brush your teeth and be gentle when you floss.  A sexual relationship may be continued unless your health care provider directs you otherwise.  Continue to go to all your prenatal visits as directed by your health care provider. SEEK MEDICAL CARE IF:   You have dizziness.  You have mild pelvic cramps, pelvic pressure, or nagging pain in the abdominal area.  You have persistent nausea, vomiting, or diarrhea.  You have a bad smelling vaginal discharge.  You have pain with urination. SEEK IMMEDIATE MEDICAL CARE IF:   You have a fever.  You are leaking fluid from your vagina.  You have spotting or bleeding from your vagina.  You have severe abdominal cramping or pain.  You  have rapid weight gain or loss.  You have shortness of breath with chest pain.  You notice sudden or extreme swelling of your face, hands, ankles, feet, or legs.  You have not felt your baby move in over an hour.  You have severe headaches that do not go away with medicine.  You have vision changes. Document Released: 08/14/2001 Document Revised: 08/25/2013 Document Reviewed: 10/21/2012 [facility] Patient Information 2015 [place], [place]. This information is not intended to replace advice given to you by your health care provider. Make sure you discuss any questions you have with your health care provider.

## 2014-05-26 NOTE — Progress Notes (Signed)
U/S(25+5wks)- vtx active fetus, EFW 2 lb (58th%tile), fluid WNL,Anterior Gr 1 placenta, cx appears closed (3.8cm), FHR-145 bpm, fundal fibroids (2.8 & 2.8cm), no areas of hemorrhage noted, female fetus

## 2014-05-27 LAB — GC/CHLAMYDIA PROBE AMP
CT Probe RNA: NEGATIVE
GC Probe RNA: NEGATIVE

## 2014-06-09 ENCOUNTER — Encounter: Payer: Medicaid Other | Admitting: Advanced Practice Midwife

## 2014-06-09 ENCOUNTER — Other Ambulatory Visit: Payer: Medicaid Other

## 2014-06-22 ENCOUNTER — Encounter: Payer: Self-pay | Admitting: Advanced Practice Midwife

## 2014-06-22 ENCOUNTER — Other Ambulatory Visit: Payer: Medicaid Other

## 2014-06-22 ENCOUNTER — Ambulatory Visit (INDEPENDENT_AMBULATORY_CARE_PROVIDER_SITE_OTHER): Payer: Medicaid Other | Admitting: Advanced Practice Midwife

## 2014-06-22 ENCOUNTER — Other Ambulatory Visit: Payer: Self-pay | Admitting: Advanced Practice Midwife

## 2014-06-22 VITALS — BP 120/76 | Wt 138.0 lb

## 2014-06-22 DIAGNOSIS — Z331 Pregnant state, incidental: Secondary | ICD-10-CM

## 2014-06-22 DIAGNOSIS — O26842 Uterine size-date discrepancy, second trimester: Secondary | ICD-10-CM

## 2014-06-22 DIAGNOSIS — Z113 Encounter for screening for infections with a predominantly sexual mode of transmission: Secondary | ICD-10-CM

## 2014-06-22 DIAGNOSIS — Z114 Encounter for screening for human immunodeficiency virus [HIV]: Secondary | ICD-10-CM

## 2014-06-22 DIAGNOSIS — Z1389 Encounter for screening for other disorder: Secondary | ICD-10-CM

## 2014-06-22 DIAGNOSIS — Z0184 Encounter for antibody response examination: Secondary | ICD-10-CM

## 2014-06-22 DIAGNOSIS — Z3483 Encounter for supervision of other normal pregnancy, third trimester: Secondary | ICD-10-CM

## 2014-06-22 LAB — POCT URINALYSIS DIPSTICK
Glucose, UA: NEGATIVE
Leukocytes, UA: NEGATIVE
Nitrite, UA: NEGATIVE
Protein, UA: NEGATIVE
RBC UA: NEGATIVE

## 2014-06-22 LAB — CBC
HCT: 31.8 % — ABNORMAL LOW (ref 36.0–46.0)
Hemoglobin: 10.4 g/dL — ABNORMAL LOW (ref 12.0–15.0)
MCH: 25.4 pg — AB (ref 26.0–34.0)
MCHC: 32.7 g/dL (ref 30.0–36.0)
MCV: 77.8 fL — ABNORMAL LOW (ref 78.0–100.0)
PLATELETS: 229 10*3/uL (ref 150–400)
RBC: 4.09 MIL/uL (ref 3.87–5.11)
RDW: 13 % (ref 11.5–15.5)
WBC: 9.8 10*3/uL (ref 4.0–10.5)

## 2014-06-22 NOTE — Progress Notes (Signed)
Pt denies any problems or concerns at this time.  

## 2014-06-22 NOTE — Progress Notes (Signed)
[id number] [redacted]w[redacted]d Estimated Date of Delivery: 09/03/14  Blood pressure 120/76, weight 62.596 kg (138 lb), last menstrual period 11/02/2013.   BP weight and urine results all reviewed and noted.  Please refer to the obstetrical flow sheet for the fundal height and fetal heart rate documentation: FH less than dates  Patient reports good fetal movement, denies any bleeding and no rupture of membranes symptoms or regular contractions. Patient is without complaints. She vomited her glucola 3 minutes after she drank it.   All questions were answered.  Plan:  Continued routine obstetrical care,   Will try zofran before PN2 appt.  May try jelly beans or concentrated glucola Follow up ASAP for [place] (EFW/AFI) and PN2

## 2014-06-23 LAB — ANTIBODY SCREEN: Antibody Screen: NEGATIVE

## 2014-06-23 LAB — HIV ANTIBODY (ROUTINE TESTING W REFLEX): HIV: NONREACTIVE

## 2014-06-23 LAB — RPR

## 2014-06-23 LAB — HSV 2 ANTIBODY, IGG: HSV 2 Glycoprotein G Ab, IgG: <0.1 IV

## 2014-06-25 ENCOUNTER — Ambulatory Visit (INDEPENDENT_AMBULATORY_CARE_PROVIDER_SITE_OTHER): Payer: Medicaid Other

## 2014-06-25 ENCOUNTER — Other Ambulatory Visit: Payer: Medicaid Other

## 2014-06-25 DIAGNOSIS — O26842 Uterine size-date discrepancy, second trimester: Secondary | ICD-10-CM

## 2014-06-25 DIAGNOSIS — Z131 Encounter for screening for diabetes mellitus: Secondary | ICD-10-CM

## 2014-06-25 NOTE — Progress Notes (Signed)
U/S(30+0wks)-vtx active fetus, EFW 3 lb 2 oz (42nd%tile), fluid WNL AFI-10.1cm SDP-4.9cm, anterior Gr 1 placenta, 2 fundal fibroids= 3.5 & 2.8cm FHR-138bpm

## 2014-06-26 LAB — GLUCOSE TOLERANCE, 2 HOURS W/ 1HR
GLUCOSE, 2 HOUR: 95 mg/dL (ref 70–139)
Glucose, 1 hour: 88 mg/dL (ref 70–170)
Glucose, Fasting: 78 mg/dL (ref 70–99)

## 2014-06-28 ENCOUNTER — Encounter: Payer: Self-pay | Admitting: Adult Health

## 2014-07-05 ENCOUNTER — Encounter: Payer: Self-pay | Admitting: Advanced Practice Midwife

## 2014-07-16 ENCOUNTER — Encounter: Payer: Self-pay | Admitting: Obstetrics and Gynecology

## 2014-07-16 ENCOUNTER — Ambulatory Visit (INDEPENDENT_AMBULATORY_CARE_PROVIDER_SITE_OTHER): Payer: Medicaid Other | Admitting: Obstetrics and Gynecology

## 2014-07-16 DIAGNOSIS — Z3483 Encounter for supervision of other normal pregnancy, third trimester: Secondary | ICD-10-CM

## 2014-07-16 DIAGNOSIS — Z331 Pregnant state, incidental: Secondary | ICD-10-CM

## 2014-07-16 DIAGNOSIS — Z1389 Encounter for screening for other disorder: Secondary | ICD-10-CM

## 2014-07-16 LAB — POCT URINALYSIS DIPSTICK
Blood, UA: NEGATIVE
Glucose, UA: NEGATIVE
Ketones, UA: NEGATIVE
Nitrite, UA: NEGATIVE
Protein, UA: NEGATIVE

## 2014-07-16 NOTE — Progress Notes (Signed)
[id number] [redacted]w[redacted]d Estimated Date of Delivery: 09/03/14  Blood pressure 120/70, weight 143 lb (64.864 kg), last menstrual period 11/02/2013.   refer to the ob flow sheet for FH and FHR, also BP, Wt, Urine results:notable for moderate 2+ Leukocytes, otherwise normal.   Patient reports good fetal movement, denies any bleeding and no rupture of membranes symptoms or regular contractions. Patient complaints: She denies any complaints at this time. She states that her birth control methods will be either her husband getting a vasectomy or Nexplanon. She voices concern for tubal ligation and if her insurance will cover it.  Fetal Heart Rate: 141 Fundal Height:   Questions were answered. Assessment: routine Ob visit.                          Considering BTL                         Plan:  Continued routine obstetrical care, counseling on tubal ligation with pt.  Sign btl papers next visit?  F/u in 2 weeks for routine OB visits, sign btl papers  This chart was scribed for Jonnie Kind, MD by Steva Colder, ED Scribe. The patient was seen in room 3 at 10:06 AM.

## 2014-07-16 NOTE — Progress Notes (Signed)
Pt denies any problems or concerns at this time.  

## 2014-08-02 ENCOUNTER — Encounter: Payer: Medicaid Other | Admitting: Women's Health

## 2014-08-17 ENCOUNTER — Ambulatory Visit (INDEPENDENT_AMBULATORY_CARE_PROVIDER_SITE_OTHER): Payer: Medicaid Other | Admitting: Women's Health

## 2014-08-17 ENCOUNTER — Encounter: Payer: Self-pay | Admitting: Women's Health

## 2014-08-17 VITALS — BP 104/64 | Wt 152.0 lb

## 2014-08-17 DIAGNOSIS — Z331 Pregnant state, incidental: Secondary | ICD-10-CM

## 2014-08-17 DIAGNOSIS — Z3685 Encounter for antenatal screening for Streptococcus B: Secondary | ICD-10-CM

## 2014-08-17 DIAGNOSIS — Z118 Encounter for screening for other infectious and parasitic diseases: Secondary | ICD-10-CM

## 2014-08-17 DIAGNOSIS — Z1159 Encounter for screening for other viral diseases: Secondary | ICD-10-CM

## 2014-08-17 DIAGNOSIS — Z1389 Encounter for screening for other disorder: Secondary | ICD-10-CM

## 2014-08-17 DIAGNOSIS — Z3483 Encounter for supervision of other normal pregnancy, third trimester: Secondary | ICD-10-CM

## 2014-08-17 LAB — POCT URINALYSIS DIPSTICK
GLUCOSE UA: NEGATIVE
Ketones, UA: NEGATIVE
NITRITE UA: NEGATIVE
Protein, UA: NEGATIVE
RBC UA: NEGATIVE

## 2014-08-17 LAB — OB RESULTS CONSOLE GC/CHLAMYDIA
CHLAMYDIA, DNA PROBE: NEGATIVE
Gonorrhea: NEGATIVE

## 2014-08-17 NOTE — Patient Instructions (Signed)
Call the office ([phone]) or go to [facility] if:  You begin to have strong, frequent contractions  Your water breaks.  Sometimes it is a big gush of fluid, sometimes it is just a trickle that keeps getting your panties wet or running down your legs  You have vaginal bleeding.  It is normal to have a small amount of spotting if your cervix was checked.   You don't feel your baby moving like normal.  If you don't, get you something to eat and drink and lay down and focus on feeling your baby move.  You should feel at least 10 movements in 2 hours.  If you don't, you should call the office or go to [facility].    Braxton Hicks Contractions Contractions of the uterus can occur throughout pregnancy. Contractions are not always a sign that you are in labor.  WHAT ARE BRAXTON HICKS CONTRACTIONS?  Contractions that occur before labor are called Braxton Hicks contractions, or false labor. Toward the end of pregnancy (32-34 weeks), these contractions can develop more often and may become more forceful. This is not true labor because these contractions do not result in opening (dilatation) and thinning of the cervix. They are sometimes difficult to tell apart from true labor because these contractions can be forceful and people have different pain tolerances. You should not feel embarrassed if you go to the hospital with false labor. Sometimes, the only way to tell if you are in true labor is for your health care provider to look for changes in the cervix. If there are no prenatal problems or other health problems associated with the pregnancy, it is completely safe to be sent home with false labor and await the onset of true labor. HOW CAN YOU TELL THE DIFFERENCE BETWEEN TRUE AND FALSE LABOR? False Labor  The contractions of false labor are usually shorter and not as hard as those of true labor.   The contractions are usually irregular.   The contractions are often felt in the front of  the lower abdomen and in the groin.   The contractions may go away when you walk around or change positions while lying down.   The contractions get weaker and are shorter lasting as time goes on.   The contractions do not usually become progressively stronger, regular, and closer together as with true labor.  True Labor  Contractions in true labor last 30-70 seconds, become very regular, usually become more intense, and increase in frequency.   The contractions do not go away with walking.   The discomfort is usually felt in the top of the uterus and spreads to the lower abdomen and low back.   True labor can be determined by your health care provider with an exam. This will show that the cervix is dilating and getting thinner.  WHAT TO REMEMBER  Keep up with your usual exercises and follow other instructions given by your health care provider.   Take medicines as directed by your health care provider.   Keep your regular prenatal appointments.   Eat and drink lightly if you think you are going into labor.   If Braxton Hicks contractions are making you uncomfortable:   Change your position from lying down or resting to walking, or from walking to resting.   Sit and rest in a tub of warm water.   Drink 2-3 glasses of water. Dehydration may cause these contractions.   Do slow and deep breathing several times an hour.    WHEN SHOULD I SEEK IMMEDIATE MEDICAL CARE? Seek immediate medical care if:  Your contractions become stronger, more regular, and closer together.   You have fluid leaking or gushing from your vagina.   You have a fever.   You pass blood-tinged mucus.   You have vaginal bleeding.   You have continuous abdominal pain.   You have low back pain that you never had before.   You feel your baby's head pushing down and causing pelvic pressure.   Your baby is not moving as much as it used to.  Document Released: 08/20/2005 Document  Revised: 08/25/2013 Document Reviewed: 06/01/2013 [employer] Patient Information 2015 [employer]. This information is not intended to replace advice given to you by your health care provider. Make sure you discuss any questions you have with your health care provider.  

## 2014-08-17 NOTE — Progress Notes (Signed)
Low-risk OB appointment [id number] [redacted]w[redacted]d Estimated Date of Delivery: 09/03/14 BP 104/64 mmHg  Wt 152 lb (68.947 kg)  LMP 11/02/2013  BP, weight, and urine reviewed.  Refer to obstetrical flow sheet for FH & FHR.  Reports good fm.  Denies regular uc's, lof, vb, or uti s/s. No complaints. Has missed a few appts.  GBS collected SVE per request: 4/50/-2, vtx Reviewed labor s/s, fkc. Plan:  Continue routine obstetrical care  F/U in 1wk for OB appointment

## 2014-08-18 LAB — GC/CHLAMYDIA PROBE AMP
CT Probe RNA: NEGATIVE
GC Probe RNA: NEGATIVE

## 2014-08-18 LAB — STREP B DNA PROBE: GBSP: NOT DETECTED

## 2014-08-21 ENCOUNTER — Inpatient Hospital Stay (HOSPITAL_COMMUNITY)
Admission: AD | Admit: 2014-08-21 | Discharge: 2014-08-23 | DRG: 775 | Disposition: A | Discharge status: Home / Self Care | Payer: Medicaid Other | Source: Ambulatory Visit | Attending: Obstetrics & Gynecology | Admitting: Obstetrics & Gynecology

## 2014-08-21 ENCOUNTER — Inpatient Hospital Stay (HOSPITAL_COMMUNITY): Payer: Medicaid Other | Admitting: Anesthesiology

## 2014-08-21 ENCOUNTER — Encounter (HOSPITAL_COMMUNITY): Payer: Self-pay

## 2014-08-21 ENCOUNTER — Encounter (HOSPITAL_COMMUNITY): Payer: Self-pay | Admitting: *Deleted

## 2014-08-21 ENCOUNTER — Inpatient Hospital Stay (HOSPITAL_COMMUNITY)
Admission: AD | Admit: 2014-08-21 | Discharge: 2014-08-21 | Disposition: A | Discharge status: Home / Self Care | Payer: Medicaid Other | Source: Ambulatory Visit | Attending: Obstetrics & Gynecology | Admitting: Obstetrics & Gynecology

## 2014-08-21 DIAGNOSIS — O9989 Other specified diseases and conditions complicating pregnancy, childbirth and the puerperium: Secondary | ICD-10-CM | POA: Diagnosis present

## 2014-08-21 DIAGNOSIS — O3413 Maternal care for benign tumor of corpus uteri, third trimester: Principal | ICD-10-CM | POA: Diagnosis present

## 2014-08-21 DIAGNOSIS — Z3A38 38 weeks gestation of pregnancy: Secondary | ICD-10-CM | POA: Diagnosis present

## 2014-08-21 DIAGNOSIS — N930 Postcoital and contact bleeding: Secondary | ICD-10-CM | POA: Diagnosis present

## 2014-08-21 DIAGNOSIS — Z3483 Encounter for supervision of other normal pregnancy, third trimester: Secondary | ICD-10-CM | POA: Diagnosis present

## 2014-08-21 LAB — CBC
HCT: 34 % — ABNORMAL LOW (ref 36.0–46.0)
Hemoglobin: 10.9 g/dL — ABNORMAL LOW (ref 12.0–15.0)
MCH: 23.9 pg — ABNORMAL LOW (ref 26.0–34.0)
MCHC: 32.1 g/dL (ref 30.0–36.0)
MCV: 74.6 fL — ABNORMAL LOW (ref 78.0–100.0)
Platelets: 223 10*3/uL (ref 150–400)
RBC: 4.56 MIL/uL (ref 3.87–5.11)
RDW: 14.4 % (ref 11.5–15.5)
WBC: 11.8 10*3/uL — ABNORMAL HIGH (ref 4.0–10.5)

## 2014-08-21 LAB — TYPE AND SCREEN
ABO/RH(D): O POS
Antibody Screen: NEGATIVE

## 2014-08-21 LAB — POCT FERN TEST: POCT FERN TEST: NEGATIVE

## 2014-08-21 MED ORDER — LIDOCAINE HCL (PF) 1 % IJ SOLN
INTRAMUSCULAR | Status: DC | PRN
  Administered 2014-08-21: 6 mL
  Administered 2014-08-21: 4 mL

## 2014-08-21 MED ORDER — LACTATED RINGERS IV SOLN: INTRAVENOUS | Status: DC

## 2014-08-21 MED ORDER — ACETAMINOPHEN 325 MG PO TABS: 650.0000 mg | ORAL_TABLET | ORAL | Status: DC | PRN

## 2014-08-21 MED ORDER — FLEET ENEMA 7-19 GM/118ML RE ENEM: 1.0000 | ENEMA | RECTAL | Status: DC | PRN

## 2014-08-21 MED ORDER — LACTATED RINGERS IV SOLN: 500.0000 mL | INTRAVENOUS | Status: DC | PRN

## 2014-08-21 MED ORDER — PHENYLEPHRINE 40 MCG/ML (10ML) SYRINGE FOR IV PUSH (FOR BLOOD PRESSURE SUPPORT)
80.0000 ug | PREFILLED_SYRINGE | INTRAVENOUS | Status: DC | PRN
  Filled 2014-08-21: qty 10
  Filled 2014-08-21: qty 2

## 2014-08-21 MED ORDER — FENTANYL 2.5 MCG/ML BUPIVACAINE 1/10 % EPIDURAL INFUSION (WH - ANES): 14.0000 mL/h | INTRAMUSCULAR | Status: DC | PRN

## 2014-08-21 MED ORDER — DIPHENHYDRAMINE HCL 50 MG/ML IJ SOLN: 12.5000 mg | INTRAMUSCULAR | Status: DC | PRN

## 2014-08-21 MED ORDER — LIDOCAINE HCL (PF) 1 % IJ SOLN
30.0000 mL | INTRAMUSCULAR | Status: DC | PRN
  Filled 2014-08-21: qty 30

## 2014-08-21 MED ORDER — FENTANYL CITRATE 0.05 MG/ML IJ SOLN
100.0000 ug | INTRAMUSCULAR | Status: DC | PRN
  Administered 2014-08-21: 100 ug via INTRAVENOUS

## 2014-08-21 MED ORDER — EPHEDRINE 5 MG/ML INJ
10.0000 mg | INTRAVENOUS | Status: DC | PRN
  Filled 2014-08-21: qty 2

## 2014-08-21 MED ORDER — CITRIC ACID-SODIUM CITRATE 334-500 MG/5ML PO SOLN: 30.0000 mL | ORAL | Status: DC | PRN

## 2014-08-21 MED ORDER — OXYCODONE-ACETAMINOPHEN 5-325 MG PO TABS
1.0000 | ORAL_TABLET | ORAL | Status: DC | PRN
Start: 2014-08-21 — End: 2014-08-22
  Administered 2014-08-21: 1 via ORAL
  Filled 2014-08-21: qty 1

## 2014-08-21 MED ORDER — OXYTOCIN 40 UNITS IN LACTATED RINGERS INFUSION - SIMPLE MED
62.5000 mL/h | INTRAVENOUS | Status: DC
  Filled 2014-08-21: qty 1000

## 2014-08-21 MED ORDER — FENTANYL 2.5 MCG/ML BUPIVACAINE 1/10 % EPIDURAL INFUSION (WH - ANES)
14.0000 mL/h | INTRAMUSCULAR | Status: DC | PRN
  Administered 2014-08-21 (×2): 14 mL/h via EPIDURAL
  Filled 2014-08-21: qty 125

## 2014-08-21 MED ORDER — ONDANSETRON HCL 4 MG/2ML IJ SOLN: 4.0000 mg | Freq: Four times a day (QID) | INTRAMUSCULAR | Status: DC | PRN

## 2014-08-21 MED ORDER — FENTANYL CITRATE 0.05 MG/ML IJ SOLN
INTRAMUSCULAR | Status: AC
  Filled 2014-08-21: qty 2

## 2014-08-21 MED ORDER — OXYTOCIN BOLUS FROM INFUSION
500.0000 mL | INTRAVENOUS | Status: DC
  Administered 2014-08-21: 500 mL via INTRAVENOUS

## 2014-08-21 MED ORDER — OXYCODONE-ACETAMINOPHEN 5-325 MG PO TABS: 2.0000 | ORAL_TABLET | ORAL | Status: DC | PRN

## 2014-08-21 MED ORDER — LACTATED RINGERS IV SOLN
500.0000 mL | Freq: Once | INTRAVENOUS | Status: AC
  Administered 2014-08-21: 500 mL via INTRAVENOUS

## 2014-08-21 MED ORDER — PHENYLEPHRINE 40 MCG/ML (10ML) SYRINGE FOR IV PUSH (FOR BLOOD PRESSURE SUPPORT)
80.0000 ug | PREFILLED_SYRINGE | INTRAVENOUS | Status: DC | PRN
  Filled 2014-08-21: qty 2

## 2014-08-21 NOTE — Anesthesia Procedure Notes (Signed)
Epidural Patient location during procedure: OB  Preanesthetic Checklist Completed: patient identified, site marked, surgical consent, pre-op evaluation, timeout performed, IV checked, risks and benefits discussed and monitors and equipment checked  Epidural Patient position: sitting Prep: site prepped and draped and DuraPrep Patient monitoring: continuous pulse ox and blood pressure Approach: midline Location: L3-L4 Injection technique: LOR air  Needle:  Needle type: Tuohy  Needle gauge: 17 G Needle length: 9 cm and 9 Needle insertion depth: 5 cm cm Catheter type: closed end flexible Catheter size: 19 Gauge Catheter at skin depth: 10 cm Test dose: negative  Assessment Events: blood not aspirated, injection not painful, no injection resistance, negative IV test and no paresthesia  Additional Notes Dosing of Epidural:  1st dose, through catheter ............................................Marland Kitchen  Xylocaine 40 mg  2nd dose, through catheter, after waiting 3 minutes........Marland KitchenXylocaine 60 mg    ( 1% Xylo charted as a single dose in Epic Meds for ease of charting; actual dosing was fractionated as above, for saftey's sake)  As each dose occurred, patient was free of IV sx; and patient exhibited no evidence of SA injection.  Patient is more comfortable after epidural dosed. Please see RN's note for documentation of vital signs,and FHR which are stable.  Patient reminded not to try to ambulate with numb legs, and that an RN must be present when she attempts to get up.

## 2014-08-21 NOTE — MAU Note (Signed)
Pt states here for possible leaking of fluid. Had gush of fluid x2 this am, unsure if it was urine. Later today had other instances, however much smaller amounts. Contractions q10 minutes apart.

## 2014-08-21 NOTE — H&P (Signed)
Faith Allen is a 28 y.o. G79P2002 female at [redacted]w[redacted]d by 7wk u/s, presenting for srom clear fluid at 1830 and labor.   Reports active fetal movement, contractions: regular, vaginal bleeding: none, membranes: ruptured, clear fluid. Initiated prenatal care at [facility] at 13.5 wks.   Most recent u/s 10/23 @ 30wks d/t s<d, efw 42% and afi 10.1/sdp 4.9cm. Pregnancy complicated by small stable uterine fibroids, postcoital bleeding at 25wks.  H/O term uncomplicated SVB x 2  Past Medical History: Past Medical History  Diagnosis Date  . Medical history non-contributory     Past Surgical History: Past Surgical History  Procedure Laterality Date  . No past surgeries      Obstetrical History: OB History    Gravida Para Term Preterm AB TAB SAB Ectopic Multiple Living   3 2 2       2       Social History: History   Social History  . Marital Status: Married    Spouse Name: N/A    Number of Children: N/A  . Years of Education: N/A   Social History Main Topics  . Smoking status: Never Smoker   . Smokeless tobacco: Never Used  . Alcohol Use: No  . Drug Use: No  . Sexual Activity: Not Currently    Birth Control/ Protection: None   Other Topics Concern  . None   Social History Narrative    Family History: Family History  Problem Relation Age of Onset  . Diabetes Mother   . Miscarriages / Stillbirths Sister   . Irritable bowel syndrome Sister     Allergies: No Known Allergies  Prescriptions prior to admission  Medication Sig Dispense Refill Last Dose  . Prenatal Vit-Fe Fumarate-FA (MULTIVITAMIN-PRENATAL) 27-0.8 MG TABS tablet Take 1 tablet by mouth daily at 12 noon.   Taking     Review of Systems  Pertinent pos/neg as indicated in HPI    Blood pressure 146/88, pulse 66, resp. rate 18, last menstrual period 11/02/2013. General appearance: alert, cooperative and mild distress Lungs: clear to auscultation bilaterally Heart: regular rate and rhythm Abdomen: gravid, soft,  non-tender Extremities: trace edema DTR's 2+  Fetal monitoring: FHR: 125 bpm, variability: moderate,  Accelerations: Present,  decelerations:  Absent Uterine activity: regular, q 2-4  Dilation: 5 Effacement (%): 90 Station: -2 Exam by:: Blima Singer RN Presentation: cephalic   Prenatal labs: ABO, Rh: O/POS/-- (07/01 1529) Antibody: NEG (10/20 0935) Rubella:  immune RPR: NON REAC (10/20 0935)  HBsAg: NEGATIVE (07/01 1529)  HIV: NONREACTIVE (10/20 0935)  GBS: NOT DETECTED (12/15 1508)   2 hr Glucola: 78/88/95 Genetic screening:  declined Anatomy US: normal   Results for orders placed or performed during the hospital encounter of 08/21/14 (from the past 24 hour(s))  Fern Test   Collection Time: 08/21/14  5:28 PM  Result Value Ref Range   POCT Fern Test Negative = intact amniotic membranes      Assessment:  [redacted]w[redacted]d SIUP  G3P2002  SOL w/ SROM clear fluid  Cat 1 FHR  GBS NOT DETECTED (12/15 1508)  Plan:  Admit to [facility]  IV pain meds/epidural prn active labor  Expectant management  Anticipate NSVB   Plans to breastfeed  Contraception: nexplanon  Circumcision: plans for outpatient  Tawnya Crook CNM, WHNP-BC 08/21/2014, 7:47 PM

## 2014-08-21 NOTE — Anesthesia Preprocedure Evaluation (Signed)

## 2014-08-21 NOTE — MAU Note (Signed)
Pt presents with SROM at 1830. Clear fluid. Tearful with contractions every 5 min.

## 2014-08-21 NOTE — MAU Provider Note (Signed)
Faith Allen is a 28 y.o. G3P2002 at [redacted]w[redacted]d here reporting leaking fluid. States she has had 2 episodes of leaking fluid since this morning. Feeling some mild contractions. Cervix was 4/50% in the office yesterday. RN requests speculum exam for rule out SROM only. Uncomplicated prenatal course.   O: Spec: negative for pool [place]: negative  Dilation: 4 Effacement (%): 50 Cervical Position: Posterior Station: -2 Presentation: Vertex Exam by:: N.Elana Jian,CNM   FHR: 135 bpm, variability: moderate,  accelerations:  Present,  decelerations:  Absent  A/P: intact membranes Pt to be discharged to home w/ labor precautions, f/u as scheduled or sooner PRN

## 2014-08-22 LAB — RPR

## 2014-08-22 MED ORDER — SIMETHICONE 80 MG PO CHEW: 80.0000 mg | CHEWABLE_TABLET | ORAL | Status: DC | PRN

## 2014-08-22 MED ORDER — SODIUM CHLORIDE 0.9 % IJ SOLN: 3.0000 mL | Freq: Two times a day (BID) | INTRAMUSCULAR | Status: DC

## 2014-08-22 MED ORDER — LANOLIN HYDROUS EX OINT: TOPICAL_OINTMENT | CUTANEOUS | Status: DC | PRN

## 2014-08-22 MED ORDER — SODIUM CHLORIDE 0.9 % IJ SOLN: 3.0000 mL | INTRAMUSCULAR | Status: DC | PRN

## 2014-08-22 MED ORDER — BISACODYL 10 MG RE SUPP: 10.0000 mg | Freq: Every day | RECTAL | Status: DC | PRN

## 2014-08-22 MED ORDER — DIPHENHYDRAMINE HCL 25 MG PO CAPS: 25.0000 mg | ORAL_CAPSULE | Freq: Four times a day (QID) | ORAL | Status: DC | PRN

## 2014-08-22 MED ORDER — SENNOSIDES-DOCUSATE SODIUM 8.6-50 MG PO TABS
2.0000 | ORAL_TABLET | ORAL | Status: DC
  Administered 2014-08-22 (×2): 2 via ORAL
  Filled 2014-08-22 (×2): qty 2

## 2014-08-22 MED ORDER — MEASLES, MUMPS & RUBELLA VAC ~~LOC~~ INJ
0.5000 mL | INJECTION | Freq: Once | SUBCUTANEOUS | Status: DC
  Filled 2014-08-22: qty 0.5

## 2014-08-22 MED ORDER — OXYTOCIN 40 UNITS IN LACTATED RINGERS INFUSION - SIMPLE MED: 62.5000 mL/h | INTRAVENOUS | Status: DC | PRN

## 2014-08-22 MED ORDER — FLEET ENEMA 7-19 GM/118ML RE ENEM: 1.0000 | ENEMA | Freq: Every day | RECTAL | Status: DC | PRN

## 2014-08-22 MED ORDER — ZOLPIDEM TARTRATE 5 MG PO TABS: 5.0000 mg | ORAL_TABLET | Freq: Every evening | ORAL | Status: DC | PRN

## 2014-08-22 MED ORDER — PRENATAL MULTIVITAMIN CH
1.0000 | ORAL_TABLET | Freq: Every day | ORAL | Status: DC
  Administered 2014-08-22 – 2014-08-23 (×2): 1 via ORAL
  Filled 2014-08-22 (×2): qty 1

## 2014-08-22 MED ORDER — SODIUM CHLORIDE 0.9 % IV SOLN: 250.0000 mL | INTRAVENOUS | Status: DC | PRN

## 2014-08-22 MED ORDER — ONDANSETRON HCL 4 MG/2ML IJ SOLN: 4.0000 mg | INTRAMUSCULAR | Status: DC | PRN

## 2014-08-22 MED ORDER — BENZOCAINE-MENTHOL 20-0.5 % EX AERO
1.0000 "application " | INHALATION_SPRAY | CUTANEOUS | Status: DC | PRN
  Administered 2014-08-22: 1 via TOPICAL
  Filled 2014-08-22: qty 56

## 2014-08-22 MED ORDER — OXYCODONE-ACETAMINOPHEN 5-325 MG PO TABS: 2.0000 | ORAL_TABLET | ORAL | Status: DC | PRN

## 2014-08-22 MED ORDER — DIBUCAINE 1 % RE OINT: 1.0000 "application " | TOPICAL_OINTMENT | RECTAL | Status: DC | PRN

## 2014-08-22 MED ORDER — TETANUS-DIPHTH-ACELL PERTUSSIS 5-2.5-18.5 LF-MCG/0.5 IM SUSP: 0.5000 mL | Freq: Once | INTRAMUSCULAR | Status: DC

## 2014-08-22 MED ORDER — WITCH HAZEL-GLYCERIN EX PADS: 1.0000 "application " | MEDICATED_PAD | CUTANEOUS | Status: DC | PRN

## 2014-08-22 MED ORDER — IBUPROFEN 600 MG PO TABS
600.0000 mg | ORAL_TABLET | Freq: Four times a day (QID) | ORAL | Status: DC
  Administered 2014-08-22 – 2014-08-23 (×5): 600 mg via ORAL
  Filled 2014-08-22 (×6): qty 1

## 2014-08-22 MED ORDER — OXYCODONE-ACETAMINOPHEN 5-325 MG PO TABS: 1.0000 | ORAL_TABLET | ORAL | Status: DC | PRN

## 2014-08-22 MED ORDER — ONDANSETRON HCL 4 MG PO TABS: 4.0000 mg | ORAL_TABLET | ORAL | Status: DC | PRN

## 2014-08-22 NOTE — Anesthesia Postprocedure Evaluation (Signed)
  Anesthesia Post-op Note  Patient: Faith Allen  Procedure(s) Performed: * No procedures listed *  Patient Location: PACU and Mother/Baby  Anesthesia Type:Epidural  Level of Consciousness: awake, alert  and oriented  Airway and Oxygen Therapy: Patient Spontanous Breathing  Post-op Pain: mild  Post-op Assessment: Post-op Vital signs reviewed  Post-op Vital Signs: Reviewed and stable  Last Vitals:  Filed Vitals:   08/22/14 0615  BP: 111/63  Pulse: 67  Temp: 36.8 C  Resp: 18    Complications: No apparent anesthesia complications

## 2014-08-22 NOTE — Lactation Note (Signed)
This note was copied from the chart of [place]. Lactation Consultation Note  Patient Name: Faith Allen [id number] Date: 08/22/2014 Reason for consult: Initial assessment of this mom and baby, now 23 hours pp.  Mom is experienced with breastfeeding, stating she nursed her oldest child for 2 months and the second child for 7 months.  Mom says she is aware of how to hand express her colostrum/milk and how to be sure baby is latching deeply.  RN, Eustaquio Maize had provided comfort gelpads for mom to use between feedings on nipples and LC reviewed nipple care and gelpad use.  LC encouraged frequent STS and cue feedings. Mom encouraged to feed baby 8-12 times/24 hours and with feeding cues. LC encouraged review of Baby and Me pp 9, 14 and 20-25 for STS and BF information. LC provided [employer] Resource brochure and reviewed [facility] services and list of community and web site resources.   Maternal Data Formula Feeding for Exclusion: No Has patient been taught Hand Expression?: Yes (experienced mom; states she knows how to hand express and apply ebm to nipples) Does the patient have breastfeeding experience prior to this delivery?: Yes  Feeding Feeding Type: Breast Fed Length of feed: 15 min  LATCH Score/Interventions         Most recent LATCH score=8 per RN assessment    Interventions (Mild/moderate discomfort): Comfort gels        Lactation Tools Discussed/Used   STS, cue feedings, hand expression  Consult Status Consult Status: Follow-up Date: 08/23/14 Follow-up type: In-patient    [name] [facility] 08/22/2014, 8:44 PM

## 2014-08-22 NOTE — Progress Notes (Signed)
Post Partum Day 1 Subjective: no complaints  Eating, drinking, voiding, ambulating well.  +flatus.  Lochia and pain wnl.  Denies dizziness, lightheadedness, or sob. No complaints. Breastfeeding well  Objective: Blood pressure 111/63, pulse 67, temperature 98.2 F (36.8 C), temperature source Oral, resp. rate 18, height 5' (1.524 m), weight 68.493 kg (151 lb), last menstrual period 11/02/2013, SpO2 99 %, unknown if currently breastfeeding.  Physical Exam:  General: alert, cooperative and no distress Lochia: appropriate Uterine Fundus: firm Incision: n/a DVT Evaluation: No evidence of DVT seen on physical exam. Negative Homan's sign. No cords or calf tenderness. No significant calf/ankle edema.   Recent Labs  08/21/14 1910  HGB 10.9*  HCT 34.0*    Assessment/Plan: Plan for discharge tomorrow  Breastfeeding Nexplanon vs. Interim BTL   LOS: 1 day   Tawnya Crook 08/22/2014, 8:12 AM

## 2014-08-23 MED ORDER — IBUPROFEN 600 MG PO TABS: 600.0000 mg | ORAL_TABLET | Freq: Four times a day (QID) | ORAL | Status: DC

## 2014-08-23 MED ORDER — DOCUSATE SODIUM 100 MG PO CAPS: 100.0000 mg | ORAL_CAPSULE | Freq: Two times a day (BID) | ORAL | Status: DC | PRN

## 2014-08-23 NOTE — Discharge Instructions (Signed)

## 2014-08-23 NOTE — Lactation Note (Signed)
This note was copied from the chart of [place]. Lactation Consultation Note: Follow up visit with mom before DC. Mom reports that baby just finished nursing for 40 min. Reports he seems more satisfied like she may be making more milk. Reports some pain with initial latch that eases off after a few minutes. Has comfort gels  Last LS 9 by RN. No questions at present. To call prn   Patient Name: Faith Allen [id number] Date: 08/23/2014 Reason for consult: Follow-up assessment   Maternal Data Formula Feeding for Exclusion: No Has patient been taught Hand Expression?: Yes Does the patient have breastfeeding experience prior to this delivery?: Yes  Feeding Feeding Type: Breast Fed  LATCH Score/Interventions Latch: Grasps breast easily, tongue down, lips flanged, rhythmical sucking. Intervention(s): Breast massage;Breast compression  Audible Swallowing: Spontaneous and intermittent Intervention(s): Skin to skin;Hand expression Intervention(s): Skin to skin;Hand expression  Type of Nipple: Everted at rest and after stimulation  Comfort (Breast/Nipple): Filling, red/small blisters or bruises, mild/mod discomfort  Problem noted: Mild/Moderate discomfort Interventions (Mild/moderate discomfort): Comfort gels  Hold (Positioning): No assistance needed to correctly position infant at breast.  LATCH Score: 9  Lactation Tools Discussed/Used     Consult Status Consult Status: Complete    Truddie Crumble 08/23/2014, 11:03 AM

## 2014-08-23 NOTE — Discharge Summary (Signed)
Obstetric Discharge Summary Reason for Admission: onset of labor Prenatal Procedures: ultrasound Intrapartum Procedures: spontaneous vaginal delivery Postpartum Procedures: none Complications-Operative and Postpartum: none HEMOGLOBIN  Date Value Ref Range Status  08/21/2014 10.9* 12.0 - 15.0 g/dL Final   HCT  Date Value Ref Range Status  08/21/2014 34.0* 36.0 - 46.0 % Final    Physical Exam:  General: alert, cooperative and no distress Lochia: appropriate Uterine Fundus: firm Incision: n/a DVT Evaluation: No evidence of DVT seen on physical exam. Negative Homan's sign. No cords or calf tenderness. No significant calf/ankle edema.  Discharge Diagnoses: Term Pregnancy-delivered  Discharge Information: Date: 08/23/2014 Activity: pelvic rest Diet: routine Medications: Ibuprofen and Colace Condition: stable Instructions: refer to practice specific booklet Discharge to: home  Contraception: Implanon vs BTL Follow-up Information    Follow up with [facility].   Why:  In 4-6 weeks for postpartum visit   Contact information:   [place] [id number] [phone]      Newborn Data: Live born female  Birth Weight: 6 lb 4.9 oz (2860 g) APGAR: 8, 10  Home with mother.  LEFTWICH-KIRBY, Santino Kinsella 08/23/2014, 6:39 AM

## 2014-08-23 NOTE — Progress Notes (Signed)
UR chart review completed.  

## 2014-08-24 ENCOUNTER — Encounter: Payer: Medicaid Other | Admitting: Obstetrics and Gynecology

## 2014-10-07 ENCOUNTER — Encounter: Payer: Self-pay | Admitting: Advanced Practice Midwife

## 2014-10-07 ENCOUNTER — Ambulatory Visit (INDEPENDENT_AMBULATORY_CARE_PROVIDER_SITE_OTHER): Payer: Medicaid Other | Admitting: Advanced Practice Midwife

## 2014-10-07 NOTE — Progress Notes (Signed)
  Faith Allen is a 29 y.o. who presents for a postpartum visit. She is 6 weeks postpartum following a spontaneous vaginal delivery. I have fully reviewed the prenatal and intrapartum course. The delivery was at 38.1 gestational weeks.  Anesthesia: epidural. Postpartum course has been uneventful. Baby's course has been uneventful. Baby is feeding by breastfeeding. Bleeding: no bleeding. Bowel function is normal. Bladder function is normal. Patient is not sexually active. Contraception method is none. Postpartum depression screening: negative.   Current outpatient prescriptions:  .  docusate sodium (COLACE) 100 MG capsule, Take 1 capsule (100 mg total) by mouth 2 (two) times daily as needed., Disp: 30 capsule, Rfl: 2 .  Prenatal Vit-Fe Fumarate-FA (MULTIVITAMIN-PRENATAL) 27-0.8 MG TABS tablet, Take 1 tablet by mouth daily at 12 noon., Disp: , Rfl:   Review of Systems   Constitutional: Negative for fever and chills Eyes: Negative for visual disturbances Respiratory: Negative for shortness of breath, dyspnea Cardiovascular: Negative for chest pain or palpitations  Gastrointestinal: Negative for vomiting, diarrhea and constipation Genitourinary: Negative for dysuria and urgency Musculoskeletal: Negative for back pain, joint pain, myalgias  Neurological: Negative for dizziness and headaches   Objective:     Filed Vitals:   10/07/14 1028  BP: 94/60   General:  alert, cooperative and no distress   Breasts:  negative  Lungs: clear to auscultation bilaterally  Heart:  regular rate and rhythm  Abdomen: Soft, nontender   Vulva:  normal  Vagina: normal vagina  Cervix:  closed  Corpus: Well involuted     Rectal Exam: no hemorrhoids        Assessment:    normal postpartum exam.  Plan:    1. Contraception: Nexplanon 2. Follow up in: 3 weeks or as needed.

## 2014-10-28 ENCOUNTER — Encounter: Payer: Medicaid Other | Admitting: Advanced Practice Midwife

## 2014-11-01 ENCOUNTER — Other Ambulatory Visit: Payer: Medicaid Other

## 2014-11-01 ENCOUNTER — Encounter: Payer: Medicaid Other | Admitting: Adult Health

## 2014-12-10 ENCOUNTER — Encounter (HOSPITAL_COMMUNITY): Payer: Self-pay | Admitting: *Deleted

## 2014-12-10 ENCOUNTER — Emergency Department (HOSPITAL_COMMUNITY)
Admission: EM | Admit: 2014-12-10 | Discharge: 2014-12-10 | Disposition: A | Discharge status: Home / Self Care | Payer: Self-pay | Attending: Emergency Medicine | Admitting: Emergency Medicine

## 2014-12-10 DIAGNOSIS — Z79899 Other long term (current) drug therapy: Secondary | ICD-10-CM | POA: Insufficient documentation

## 2014-12-10 DIAGNOSIS — K625 Hemorrhage of anus and rectum: Secondary | ICD-10-CM | POA: Insufficient documentation

## 2014-12-10 LAB — CBC WITH DIFFERENTIAL/PLATELET
Basophils Absolute: 0 10*3/uL (ref 0.0–0.1)
Basophils Relative: 0 % (ref 0–1)
EOS ABS: 0.1 10*3/uL (ref 0.0–0.7)
EOS PCT: 2 % (ref 0–5)
HCT: 36.9 % (ref 36.0–46.0)
HEMOGLOBIN: 12 g/dL (ref 12.0–15.0)
LYMPHS PCT: 18 % (ref 12–46)
Lymphs Abs: 1.5 10*3/uL (ref 0.7–4.0)
MCH: 27.3 pg (ref 26.0–34.0)
MCHC: 32.5 g/dL (ref 30.0–36.0)
MCV: 84.1 fL (ref 78.0–100.0)
Monocytes Absolute: 0.9 10*3/uL (ref 0.1–1.0)
Monocytes Relative: 12 % (ref 3–12)
Neutro Abs: 5.5 10*3/uL (ref 1.7–7.7)
Neutrophils Relative %: 68 % (ref 43–77)
PLATELETS: 279 10*3/uL (ref 150–400)
RBC: 4.39 MIL/uL (ref 3.87–5.11)
RDW: 15.9 % — AB (ref 11.5–15.5)
WBC: 8 10*3/uL (ref 4.0–10.5)

## 2014-12-10 LAB — BASIC METABOLIC PANEL
Anion gap: 8 (ref 5–15)
BUN: 20 mg/dL (ref 6–23)
CO2: 28 mmol/L (ref 19–32)
CREATININE: 0.66 mg/dL (ref 0.50–1.10)
Calcium: 8.6 mg/dL (ref 8.4–10.5)
Chloride: 107 mmol/L (ref 96–112)
GLUCOSE: 87 mg/dL (ref 70–99)
POTASSIUM: 3.6 mmol/L (ref 3.5–5.1)
SODIUM: 143 mmol/L (ref 135–145)

## 2014-12-10 NOTE — Discharge Instructions (Signed)
Use stool softeners (Colace )as directed. Return here at once for severe rectal bleeding, severe abdominal pain, or any other problems.  Bloody Stools Bloody stools often mean that there is a problem in the digestive tract. Your caregiver may use the term "melena" to describe black, tarry, and bad smelling stools or "hematochezia" to describe red or maroon-colored stools. Blood seen in the stool can be caused by bleeding anywhere along the intestinal tract.  A black stool usually means that blood is coming from the upper part of the gastrointestinal tract (esophagus, stomach, or small bowel). Passing maroon-colored stools or bright red blood usually means that blood is coming from lower down in the large bowel or the rectum. However, sometimes massive bleeding in the stomach or small intestine can cause bright red bloody stools.  Consuming black licorice, lead, iron pills, medicines containing bismuth subsalicylate, or blueberries can also cause black stools. Your caregiver can test black stools to see if blood is present. It is important that the cause of the bleeding be found. Treatment can then be started, and the problem can be corrected. Rectal bleeding may not be serious, but you should not assume everything is okay until you know the cause.It is very important to follow up with your caregiver or a specialist in gastrointestinal problems. CAUSES  Blood in the stools can come from various underlying causes.Often, the cause is not found during your first visit. Testing is often needed to discover the cause of bleeding in the gastrointestinal tract. Causes range from simple to serious or even life-threatening.Possible causes include:  Hemorrhoids.These are veins that are full of blood (engorged) in the rectum. They cause pain, inflammation, and may bleed.  Anal fissures.These are areas of painful tearing which may bleed. They are often caused by passing hard stool.  Diverticulosis.These are  pouches that form on the colon over time, with age, and may bleed significantly.  Diverticulitis.This is inflammation in areas with diverticulosis. It can cause pain, fever, and bloody stools, although bleeding is rare.  Proctitis and colitis. These are inflamed areas of the rectum or colon. They may cause pain, fever, and bloody stools.  Polyps and cancer. Colon cancer is a leading cause of preventable cancer death.It often starts out as precancerous polyps that can be removed during a colonoscopy, preventing progression into cancer. Sometimes, polyps and cancer may cause rectal bleeding.  Gastritis and ulcers.Bleeding from the upper gastrointestinal tract (near the stomach) may travel through the intestines and produce black, sometimes tarry, often bad smelling stools. In certain cases, if the bleeding is fast enough, the stools may not be black, but red and the condition may be life-threatening. SYMPTOMS  You may have stools that are bright red and bloody, that are normal color with blood on them, or that are dark black and tarry. In some cases, you may only have blood in the toilet bowl. Any of these cases need medical care. You may also have:  Pain at the anus or anywhere in the rectum.  Lightheadedness or feeling faint.  Extreme weakness.  Nausea or vomiting.  Fever. DIAGNOSIS Your caregiver may use the following methods to find the cause of your bleeding:  Taking a medical history. Age is important. Older people tend to develop polyps and cancer more often. If there is anal pain and a hard, large stool associated with bleeding, a tear of the anus may be the cause. If blood drips into the toilet after a bowel movement, bleeding hemorrhoids may be the problem.  The color and frequency of the bleeding are additional considerations. In most cases, the medical history provides clues, but seldom the final answer.  A visual and finger (digital) exam. Your caregiver will inspect the anal  area, looking for tears and hemorrhoids. A finger exam can provide information when there is tenderness or a growth inside. In men, the prostate is also examined.  Endoscopy. Several types of small, long scopes (endoscopes) are used to view the colon.  In the office, your caregiver may use a rigid, or more commonly, a flexible viewing sigmoidoscope. This exam is called flexible sigmoidoscopy. It is performed in 5 to 10 minutes.  A more thorough exam is accomplished with a colonoscope. It allows your caregiver to view the entire 5 to 6 foot long colon. Medicine to help you relax (sedative) is usually given for this exam. Frequently, a bleeding lesion may be present beyond the reach of the sigmoidoscope. So, a colonoscopy may be the best exam to start with. Both exams are usually done on an outpatient basis. This means the patient does not stay overnight in the hospital or surgery center.  An upper endoscopy may be needed to examine your stomach. Sedation is used and a flexible endoscope is put in your mouth, down to your stomach.  A barium enema X-ray. This is an X-ray exam. It uses liquid barium inserted by enema into the rectum. This test alone may not identify an actual bleeding point. X-rays highlight abnormal shadows, such as those made by lumps (tumors), diverticuli, or colitis. TREATMENT  Treatment depends on the cause of your bleeding.   For bleeding from the stomach or colon, the caregiver doing your endoscopy or colonoscopy may be able to stop the bleeding as part of the procedure.  Inflammation or infection of the colon can be treated with medicines.  Many rectal problems can be treated with creams, suppositories, or warm baths.  Surgery is sometimes needed.  Blood transfusions are sometimes needed if you have lost a lot of blood.  For any bleeding problem, let your caregiver know if you take aspirin or other blood thinners regularly. HOME CARE INSTRUCTIONS   Take any medicines  exactly as prescribed.  Keep your stools soft by eating a diet high in fiber. Prunes (1 to 3 a day) work well for many people.  Drink enough water and fluids to keep your urine clear or pale yellow.  Take sitz baths if advised. A sitz bath is when you sit in a bathtub with warm water for 10 to 15 minutes to soak, soothe, and cleanse the rectal area.  If enemas or suppositories are advised, be sure you know how to use them. Tell your caregiver if you have problems with this.  Monitor your bowel movements to look for signs of improvement or worsening. SEEK MEDICAL CARE IF:   You do not improve in the time expected.  Your condition worsens after initial improvement.  You develop any new symptoms. SEEK IMMEDIATE MEDICAL CARE IF:   You develop severe or prolonged rectal bleeding.  You vomit blood.  You feel weak or faint.  You have a fever. MAKE SURE YOU:  Understand these instructions.  Will watch your condition.  Will get help right away if you are not doing well or get worse. Document Released: 08/10/2002 Document Revised: 11/12/2011 Document Reviewed: 01/05/2011 [facility] Patient Information 2015 [place], [place]. This information is not intended to replace advice given to you by your health care provider. Make sure you discuss  any questions you have with your health care provider. ° °

## 2014-12-10 NOTE — ED Notes (Signed)
Blood in stool today at 1130a ,hx of constipation.

## 2014-12-10 NOTE — ED Provider Notes (Signed)
CSN: [id number]     Arrival date & time 12/10/14  1748 History   First MD Initiated Contact with Patient 12/10/14 1910     No chief complaint on file.    (Consider location/radiation/quality/duration/timing/severity/associated sxs/prior Treatment) HPI Comments: Patient here complaining of bloody stool 1 day. History of constipation states that she had painless blood approximate 2 ounces in her stools. Denies any fever or chills. No vomiting. No severe abdominal pain. Denies any severe weakness when she stands up. Does have a family history of IBS but denies any family history of ulcerative or Crohn's disease. No treatment use prior to arrival. Nothing makes her symptoms better.  The history is provided by the patient.    Past Medical History  Diagnosis Date  . Medical history non-contributory    Past Surgical History  Procedure Laterality Date  . No past surgeries     Family History  Problem Relation Age of Onset  . Diabetes Mother   . Miscarriages / Stillbirths Sister   . Irritable bowel syndrome Sister    History  Substance Use Topics  . Smoking status: Never Smoker   . Smokeless tobacco: Never Used  . Alcohol Use: Yes   OB History    Gravida Para Term Preterm AB TAB SAB Ectopic Multiple Living   3 3 3       0 3     Review of Systems  All other systems reviewed and are negative.     Allergies  Review of patient's allergies indicates no known allergies.  Home Medications   Prior to Admission medications   Medication Sig Start Date End Date Taking? Authorizing Provider  docusate sodium (COLACE) 100 MG capsule Take 1 capsule (100 mg total) by mouth 2 (two) times daily as needed. 08/23/14   Kathie Dike Leftwich-Kirby, CNM  Prenatal Vit-Fe Fumarate-FA (MULTIVITAMIN-PRENATAL) 27-0.8 MG TABS tablet Take 1 tablet by mouth daily at 12 noon.    Historical Provider, MD   BP 100/58 mmHg  Pulse 82  Temp(Src) 97.8 F (36.6 C) (Oral)  Resp 18  Ht 5' (1.524 m)  Wt 130 lb  (58.968 kg)  BMI 25.39 kg/m2  SpO2 97%  LMP   Breastfeeding? Yes Physical Exam  Constitutional: She is oriented to person, place, and time. She appears well-developed and well-nourished.  Non-toxic appearance. No distress.  HENT:  Head: Normocephalic and atraumatic.  Eyes: Conjunctivae, EOM and lids are normal. Pupils are equal, round, and reactive to light.  Neck: Normal range of motion. Neck supple. No tracheal deviation present. No thyroid mass present.  Cardiovascular: Normal rate, regular rhythm and normal heart sounds.  Exam reveals no gallop.   No murmur heard. Pulmonary/Chest: Effort normal and breath sounds normal. No stridor. No respiratory distress. She has no decreased breath sounds. She has no wheezes. She has no rhonchi. She has no rales.  Abdominal: Soft. Normal appearance and bowel sounds are normal. She exhibits no distension. There is no tenderness. There is no rebound and no CVA tenderness.  Genitourinary: Rectal exam shows no external hemorrhoid, no mass, no tenderness and anal tone normal.  Musculoskeletal: Normal range of motion. She exhibits no edema or tenderness.  Neurological: She is alert and oriented to person, place, and time. She has normal strength. No cranial nerve deficit or sensory deficit. GCS eye subscore is 4. GCS verbal subscore is 5. GCS motor subscore is 6.  Skin: Skin is warm and dry. No abrasion and no rash noted.  Psychiatric: She has a normal  mood and affect. Her speech is normal and behavior is normal.  Nursing note and vitals reviewed.   ED Course  Procedures (including critical care time) Labs Review Labs Reviewed  CBC WITH DIFFERENTIAL/PLATELET - Abnormal; Notable for the following:    RDW 15.9 (*)    All other components within normal limits  BASIC METABOLIC PANEL    Imaging Review No results found.   EKG Interpretation None      MDM   Final diagnoses:  None    Patient hemodynamically stable here. Likely with anal  fissures. Will get GI referral and patient informed to use fiber and also stool softeners.    Lacretia Leigh, MD 12/10/14 Joen Laura

## 2014-12-31 ENCOUNTER — Encounter: Payer: Self-pay | Admitting: Gastroenterology

## 2014-12-31 ENCOUNTER — Ambulatory Visit (INDEPENDENT_AMBULATORY_CARE_PROVIDER_SITE_OTHER): Payer: Medicaid Other | Admitting: Gastroenterology

## 2014-12-31 VITALS — BP 100/69 | HR 98 | Temp 98.4°F | Ht 60.0 in | Wt 114.8 lb

## 2014-12-31 DIAGNOSIS — K5900 Constipation, unspecified: Secondary | ICD-10-CM

## 2014-12-31 DIAGNOSIS — K625 Hemorrhage of anus and rectum: Secondary | ICD-10-CM | POA: Diagnosis not present

## 2014-12-31 NOTE — Progress Notes (Signed)
No pcp per patient 

## 2014-12-31 NOTE — Patient Instructions (Signed)
Recommend you take Colace 2 capsules at least every other day.   Increase your water intake. You should drink at least 60 ounces of water daily based on your weight to maintain hydration. However, with breast feeding try to drink several glasses above and beyond that.  Slowly increase dietary fiber.   At any time you can have a colonoscopy for further evaluation of rectal bleeding. Since you want to hold off right now, I would advise you to watch for further rectal bleeding and call if you have any further episodes.   Also, please monitor for further weight loss, call if you loose more than five more pounds.   Return to the office in 3 months.   Constipation Constipation is when a person has fewer than three bowel movements a week, has difficulty having a bowel movement, or has stools that are dry, hard, or larger than normal. As people grow older, constipation is more common. If you try to fix constipation with medicines that make you have a bowel movement (laxatives), the problem may get worse. Long-term laxative use may cause the muscles of the colon to become weak. A low-fiber diet, not taking in enough fluids, and taking certain medicines may make constipation worse.  CAUSES   Certain medicines, such as antidepressants, pain medicine, iron supplements, antacids, and water pills.   Certain diseases, such as diabetes, irritable bowel syndrome (IBS), thyroid disease, or depression.   Not drinking enough water.   Not eating enough fiber-rich foods.   Stress or travel.   Lack of physical activity or exercise.   Ignoring the urge to have a bowel movement.   Using laxatives too much.  SIGNS AND SYMPTOMS   Having fewer than three bowel movements a week.   Straining to have a bowel movement.   Having stools that are hard, dry, or larger than normal.   Feeling full or bloated.   Pain in the lower abdomen.   Not feeling relief after having a bowel movement.   DIAGNOSIS  Your health care provider will take a medical history and perform a physical exam. Further testing may be done for severe constipation. Some tests may include:  A barium enema X-ray to examine your rectum, colon, and, sometimes, your small intestine.   A sigmoidoscopy to examine your lower colon.   A colonoscopy to examine your entire colon. TREATMENT  Treatment will depend on the severity of your constipation and what is causing it. Some dietary treatments include drinking more fluids and eating more fiber-rich foods. Lifestyle treatments may include regular exercise. If these diet and lifestyle recommendations do not help, your health care provider may recommend taking over-the-counter laxative medicines to help you have bowel movements. Prescription medicines may be prescribed if over-the-counter medicines do not work.  HOME CARE INSTRUCTIONS   Eat foods that have a lot of fiber, such as fruits, vegetables, whole grains, and beans.  Limit foods high in fat and processed sugars, such as french fries, hamburgers, cookies, candies, and soda.   A fiber supplement may be added to your diet if you cannot get enough fiber from foods.   Drink enough fluids to keep your urine clear or pale yellow.   Exercise regularly or as directed by your health care provider.   Go to the restroom when you have the urge to go. Do not hold it.   Only take over-the-counter or prescription medicines as directed by your health care provider. Do not take other medicines for constipation  without talking to your health care provider first.  [place] IF:   You have bright red blood in your stool.   Your constipation lasts for more than 4 days or gets worse.   You have abdominal or rectal pain.   You have thin, pencil-like stools.   You have unexplained weight loss. MAKE SURE YOU:   Understand these instructions.  Will watch your condition.  Will get help  right away if you are not doing well or get worse. Document Released: 05/18/2004 Document Revised: 08/25/2013 Document Reviewed: 06/01/2013 [facility] Patient Information 2015 [place], [place]. This information is not intended to replace advice given to you by your health care provider. Make sure you discuss any questions you have with your health care provider.  High-Fiber Diet Fiber is found in fruits, vegetables, and grains. A high-fiber diet encourages the addition of more whole grains, legumes, fruits, and vegetables in your diet. The recommended amount of fiber for adult males is 38 g per day. For adult females, it is 25 g per day. Pregnant and lactating women should get 28 g of fiber per day. If you have a digestive or bowel problem, ask your caregiver for advice before adding high-fiber foods to your diet. Eat a variety of high-fiber foods instead of only a select few type of foods.  PURPOSE  To increase stool bulk.  To make bowel movements more regular to prevent constipation.  To lower cholesterol.  To prevent overeating. WHEN IS THIS DIET USED?  It may be used if you have constipation and hemorrhoids.  It may be used if you have uncomplicated diverticulosis (intestine condition) and irritable bowel syndrome.  It may be used if you need help with weight management.  It may be used if you want to add it to your diet as a protective measure against atherosclerosis, diabetes, and cancer. SOURCES OF FIBER  Whole-grain breads and cereals.  Fruits, such as apples, oranges, bananas, berries, prunes, and pears.  Vegetables, such as green peas, carrots, sweet potatoes, beets, broccoli, cabbage, spinach, and artichokes.  Legumes, such split peas, soy, lentils.  Almonds. FIBER CONTENT IN FOODS Starches and Grains / Dietary Fiber (g)  Cheerios, 1 cup / 3 g  Corn Flakes cereal, 1 cup / 0.7 g  Rice crispy treat cereal, 1 cup / 0.3 g  Instant oatmeal (cooked),  cup / 2  g  Frosted wheat cereal, 1 cup / 5.1 g  Brown, long-grain rice (cooked), 1 cup / 3.5 g  White, long-grain rice (cooked), 1 cup / 0.6 g  Enriched macaroni (cooked), 1 cup / 2.5 g Legumes / Dietary Fiber (g)  Baked beans (canned, plain, or vegetarian),  cup / 5.2 g  Kidney beans (canned),  cup / 6.8 g  Pinto beans (cooked),  cup / 5.5 g Breads and Crackers / Dietary Fiber (g)  Plain or honey graham crackers, 2 squares / 0.7 g  Saltine crackers, 3 squares / 0.3 g  Plain, salted pretzels, 10 pieces / 1.8 g  Whole-wheat bread, 1 slice / 1.9 g  White bread, 1 slice / 0.7 g  Raisin bread, 1 slice / 1.2 g  Plain bagel, 3 oz / 2 g  Flour tortilla, 1 oz / 0.9 g  Corn tortilla, 1 small / 1.5 g  Hamburger or hotdog bun, 1 small / 0.9 g Fruits / Dietary Fiber (g)  Apple with skin, 1 medium / 4.4 g  Sweetened applesauce,  cup / 1.5 g  Banana,  medium / 1.5 g  Grapes, 10 grapes / 0.4 g  Orange, 1 small / 2.3 g  Raisin, 1.5 oz / 1.6 g  Melon, 1 cup / 1.4 g Vegetables / Dietary Fiber (g)  Green beans (canned),  cup / 1.3 g  Carrots (cooked),  cup / 2.3 g  Broccoli (cooked),  cup / 2.8 g  Peas (cooked),  cup / 4.4 g  Mashed potatoes,  cup / 1.6 g  Lettuce, 1 cup / 0.5 g  Corn (canned),  cup / 1.6 g  Tomato,  cup / 1.1 g Document Released: 08/20/2005 Document Revised: 02/19/2012 Document Reviewed: 11/22/2011 [employer] Patient Information 2015 [place], [place]. This information is not intended to replace advice given to you by your health care provider. Make sure you discuss any questions you have with your health care provider.

## 2014-12-31 NOTE — Assessment & Plan Note (Signed)
29 year old female with irregular bowel movements, tendency towards constipation chronically. Recently had increased abdominal cramping and passed a bowel movement associated with rectal pain. Noted fresh blood per rectum. She has had a couple of minor episodes of bright red blood per rectum in the past. Rectal exam in the emergency department was unremarkable. Patient denied any tenderness with exam. Suspect benign anorectal source such as internal hemorrhoid or small fissure however discussed with patient today that we cannot exclude other etiology such as polyps, malignancy. Doubt IBD. We discussed consideration of colonoscopy. She is not interested at this time due to history of an acquaintance of hers succumbing to bowel perforation at time of colonoscopy. Patient would like to manage her constipation continue to monitor symptoms for a while.  Encouraged increasing fluid consumption. Slowly increase dietary fiber. Handouts provided. Encouraged her to utilize Colace more regularly. If she were to have further rectal bleeding she should notify our office and she consider colonoscopy. She's also been advised that she could have a colonoscopy any time that she decides that she is ready.  With regards to ongoing weight loss in the setting of lactation, would advise for her to continue to monitor, call if loses additional 5 pounds, make sure she has adequate dietary intake.  Return to the office in 3 months.

## 2014-12-31 NOTE — Progress Notes (Signed)
Primary Care Physician:  No PCP Per Patient  Primary Gastroenterologist:  Barney Drain, MD   Chief Complaint  Patient presents with  . Follow-up    HPI:  Faith Allen is a 29 y.o. female here for follow-up of constipation and rectal bleeding. Recently seen in the emergency department at [facility] a few weeks ago. Rectal exam at that time benign. No evidence of blood, tenderness, hemorrhoid. Hemoglobin was 12. Serum calcium was 8.6. Other electrolytes were normal.  Patient reports chronic irregular bowel movements as long as she can remember. Describes as mild in nature. Normal for her includes a bowel movement about every 4-5 days. Sometimes associated with abdominal cramping prior to BM. Abdominal cramps resolved after bowel movement. Recently had increased abdominal cramping passed a hard stool is a couple of ounces of fresh blood. Next bowel movement had very little blood noted. She's had a couple episodes of bright red blood in the past. Denies any rectal pain at this time but states she did have some with bowel movement that led to ER visit. She has taking Colace off and on since she had her baby back in December. Really does not take regularly. More recently takes every 3 days or so. Bowel movements are not always hard. Denies any upper GI symptoms. Notes that her eating habits are poor. Very little exercise. Drinks about 40 ounces of water daily.   States that she continues to have weight loss postpartum. Reports that she's down about 5-10 pounds below her prepregnancy weight. She is breast-feeding.  Discussed possibility of colonoscopy with patient today. She has an acquaintance who died from what sounds like a colonic perforation related to colonoscopy and she is very hesitant to pursue one at this time.   Current Outpatient Prescriptions  Medication Sig Dispense Refill  . docusate sodium (COLACE) 100 MG capsule Take 1 capsule (100 mg total) by mouth 2 (two) times daily as  needed. 30 capsule 2  . Probiotic Product (PROBIOTIC DAILY PO) Take by mouth daily.     No current facility-administered medications for this visit.    Allergies as of 12/31/2014  . (No Known Allergies)    Past Medical History  Diagnosis Date  . Medical history non-contributory     Past Surgical History  Procedure Laterality Date  . No past surgeries      Family History  Problem Relation Age of Onset  . Diabetes Mother   . Miscarriages / Stillbirths Sister   . Irritable bowel syndrome Sister   . Inflammatory bowel disease Neg Hx   . Colon cancer Neg Hx     History   Social History  . Marital Status: Married    Spouse Name: N/A  . Number of Children: 3  . Years of Education: N/A   Occupational History  . PT-Daycare    Social History Main Topics  . Smoking status: Never Smoker   . Smokeless tobacco: Never Used  . Alcohol Use: 0.0 oz/week    0 Standard drinks or equivalent per week     Comment: rare  . Drug Use: No  . Sexual Activity: Not Currently    Birth Control/ Protection: None   Other Topics Concern  . Not on file   Social History Narrative      ROS:  General: Negative for anorexia,   fever, chills, fatigue, weakness. See hpi Eyes: Negative for vision changes.  ENT: Negative for hoarseness, difficulty swallowing , nasal congestion. CV: Negative for chest pain,  angina, palpitations, dyspnea on exertion, peripheral edema.  Respiratory: Negative for dyspnea at rest, dyspnea on exertion, cough, sputum, wheezing.  GI: See history of present illness. GU:  Negative for dysuria, hematuria, urinary incontinence, urinary frequency, nocturnal urination.  MS: Negative for joint pain, low back pain.  Derm: Negative for rash or itching.  Neuro: Negative for weakness, abnormal sensation, seizure, frequent headaches, memory loss, confusion.  Psych: Negative for anxiety, depression, suicidal ideation, hallucinations.  Endo: Negative for unusual weight change.   Heme: Negative for bruising or bleeding. Allergy: Negative for rash or hives.    Physical Examination:  BP 100/69 mmHg  Pulse 98  Temp(Src) 98.4 F (36.9 C)  Ht 5' (1.524 m)  Wt 114 lb 12.8 oz (52.073 kg)  BMI 22.42 kg/m2   General: Well-nourished, well-developed in no acute distress.  Head: Normocephalic, atraumatic.   Eyes: Conjunctiva pink, no icterus. Mouth: Oropharyngeal mucosa moist and pink , no lesions erythema or exudate. Neck: Supple without thyromegaly, masses, or lymphadenopathy.  Lungs: Clear to auscultation bilaterally.  Heart: Regular rate and rhythm, no murmurs rubs or gallops.  Abdomen: Bowel sounds are normal, nontender, nondistended, no hepatosplenomegaly or masses, no abdominal bruits or    hernia , no rebound or guarding.   Rectal: declined.  Extremities: No lower extremity edema. No clubbing or deformities.  Neuro: Alert and oriented x 4 , grossly normal neurologically.  Skin: Warm and dry, no rash or jaundice.   Psych: Alert and cooperative, normal mood and affect.  Labs: ER labs. Lab Results  Component Value Date   WBC 8.0 12/10/2014   HGB 12.0 12/10/2014   HCT 36.9 12/10/2014   MCV 84.1 12/10/2014   PLT 279 12/10/2014   Lab Results  Component Value Date   CREATININE 0.66 12/10/2014   BUN 20 12/10/2014   NA 143 12/10/2014   K 3.6 12/10/2014   CL 107 12/10/2014   CO2 28 12/10/2014     Imaging Studies: No results found.

## 2015-02-25 ENCOUNTER — Encounter: Payer: Self-pay | Admitting: *Deleted

## 2015-02-25 ENCOUNTER — Telehealth: Payer: Self-pay | Admitting: Gastroenterology

## 2015-02-25 NOTE — Telephone Encounter (Signed)
Forwarding to [facility] to schedule.

## 2015-02-25 NOTE — Telephone Encounter (Signed)
I tried to call pt to schedule her with Dr. Oneida Alar, the home and cell number are the same when I call it gives me a recording of this number has been disconnected or changed. I left pt on schedule for Magda Paganini 04/01/15 until we speak with her. Letter will be mailed for her to contact the office.

## 2015-02-25 NOTE — Telephone Encounter (Signed)
Can we change her July appointment to be done with Dr. Oneida Alar. Patient has never met her yet.

## 2015-04-01 ENCOUNTER — Encounter: Payer: Self-pay | Admitting: Gastroenterology

## 2015-04-01 ENCOUNTER — Telehealth: Payer: Self-pay | Admitting: Gastroenterology

## 2015-04-01 ENCOUNTER — Ambulatory Visit: Payer: Medicaid Other | Admitting: Gastroenterology

## 2015-04-01 NOTE — Telephone Encounter (Signed)
PATIENT WAS A NO SHOW AND LETTER SENT  °

## 2015-07-29 IMAGING — US US OB TRANSVAGINAL
1 series · 14 of 28 positions shown · non-contrast
Comparison: None.

CLINICAL DATA: Lower abdominal pain and vaginal spotting. Ten weeks
and 5 days pregnant by last menstrual period. Quantitative beta HCG
[DATE].

EXAM:
OBSTETRIC <14 WK US AND TRANSVAGINAL OB US
TECHNIQUE: Both transabdominal and transvaginal ultrasound examinations were
performed for complete evaluation of the gestation as well as the
maternal uterus, adnexal regions, and pelvic cul-de-sac.
Transvaginal technique was performed to assess early pregnancy.

[Series 1: us ob comp less 14 wks · 56 acquisitions, 14 frames shown]
[im 3/56]
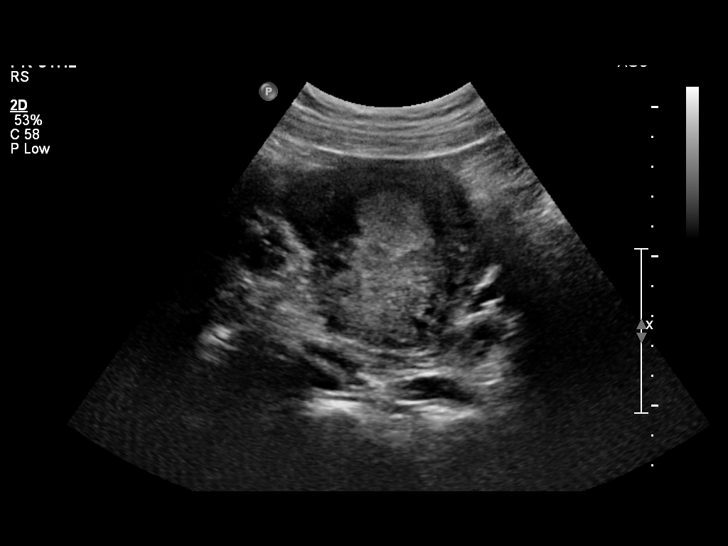
[im 7/56]
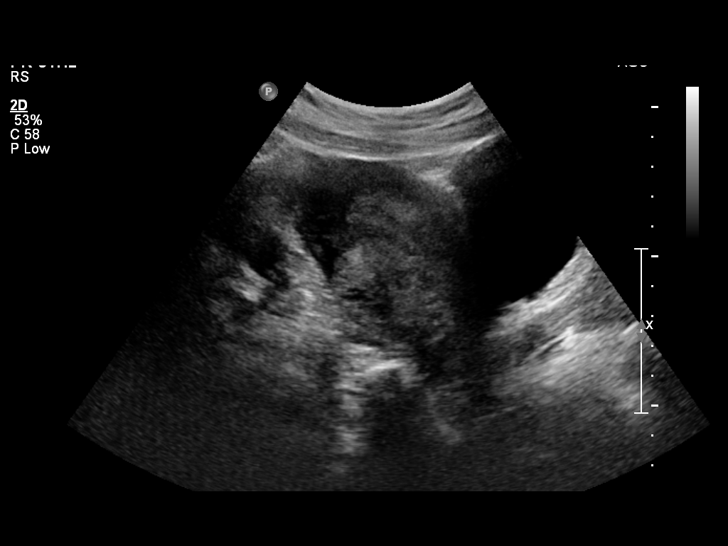
[im 11/56]
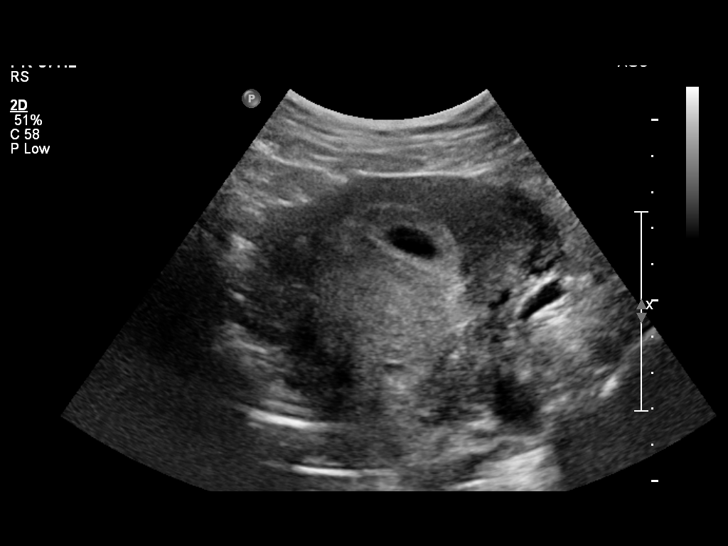
[im 15/56]
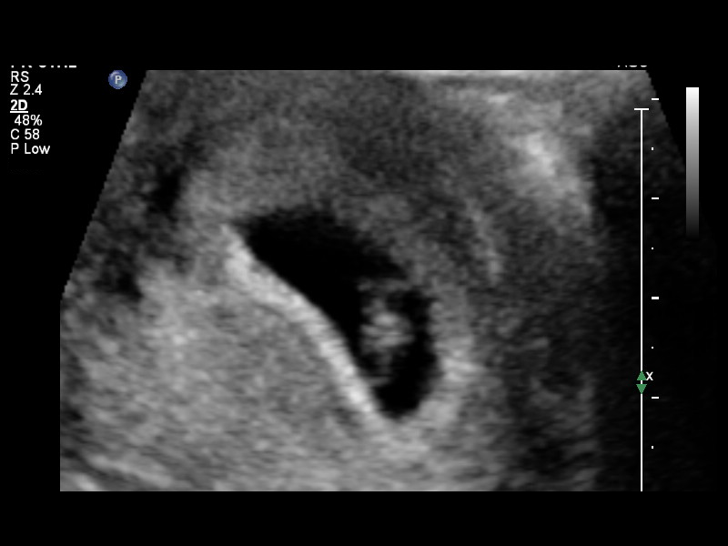
[im 19/56]
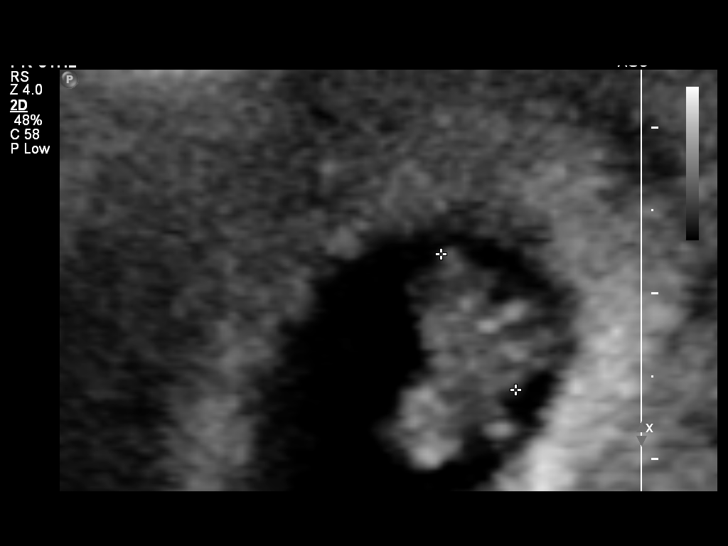
[im 23/56]
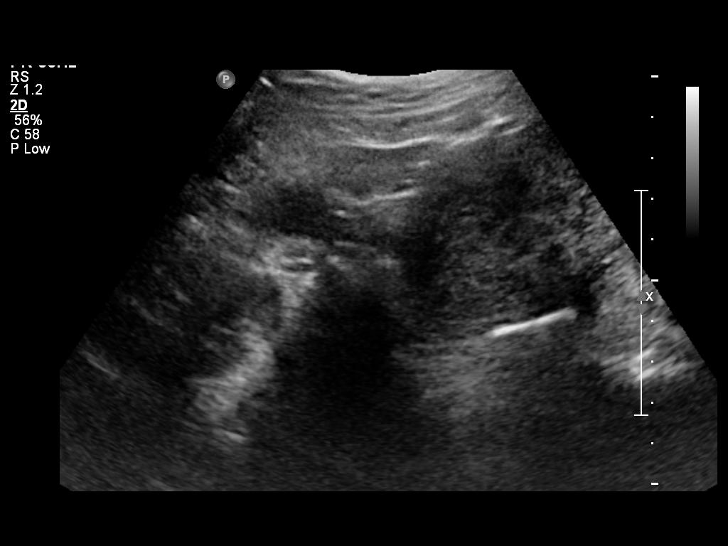
[im 27/56]
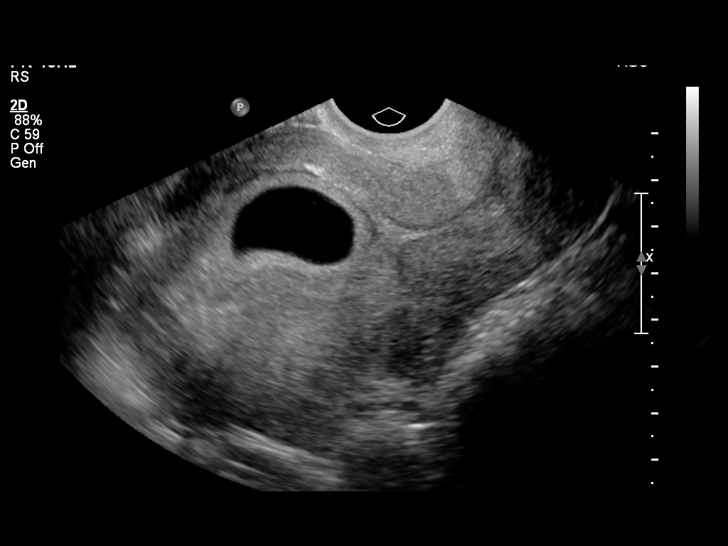
[im 31/56]
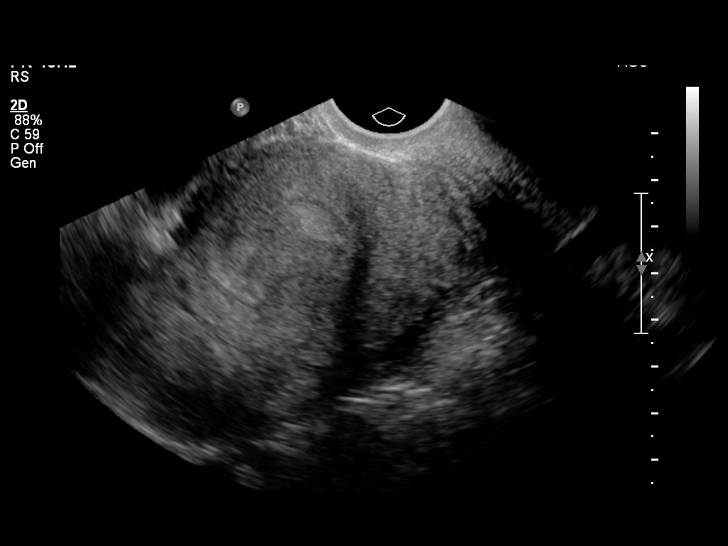
[im 35/56]
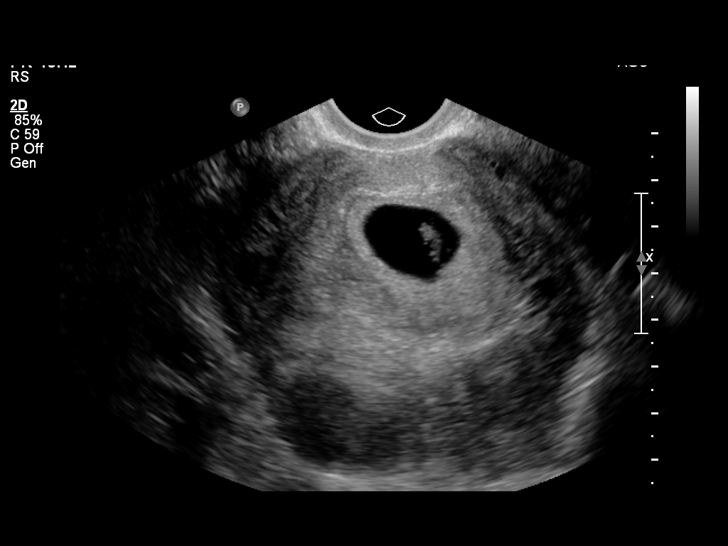
[im 39/56]
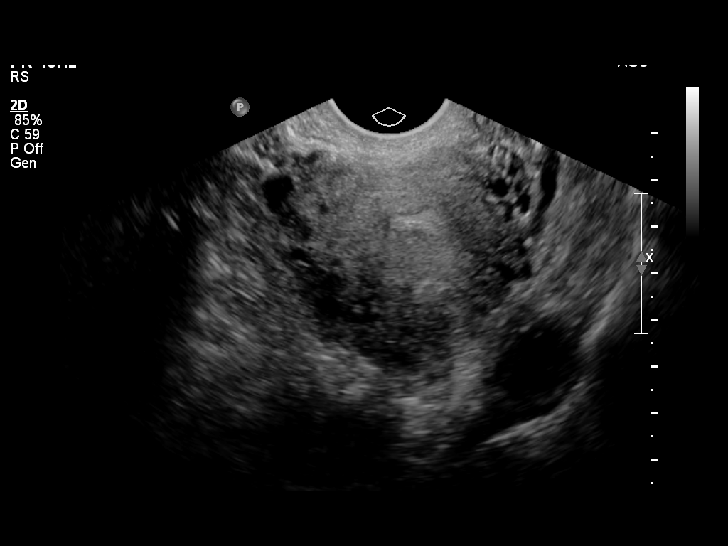
[im 43/56]
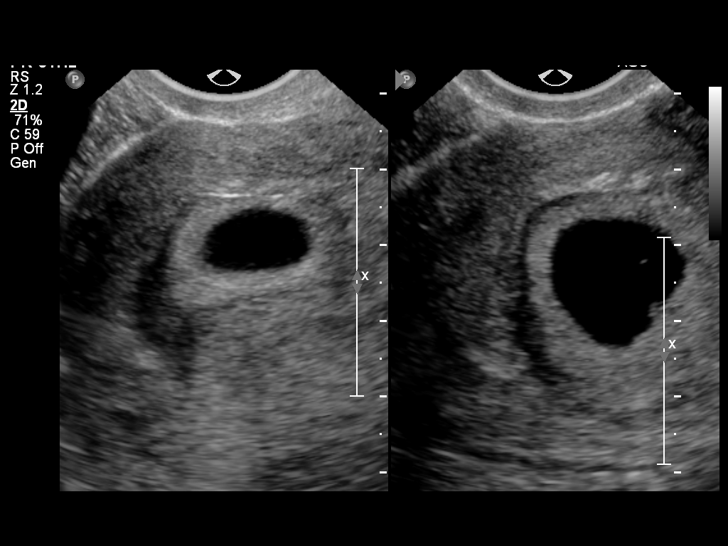
[im 47/56]
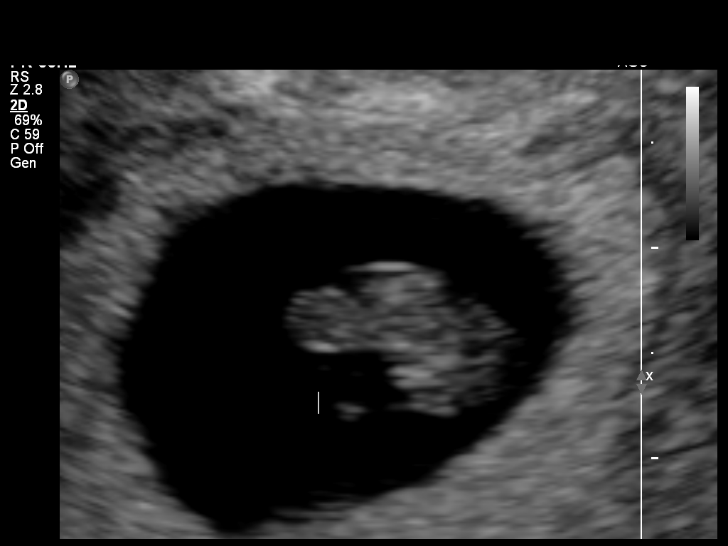
[im 51/56]
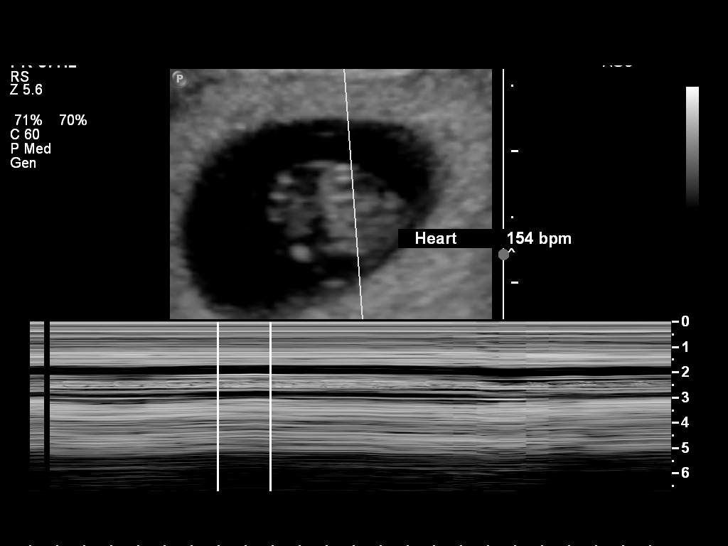
[im 56/56]
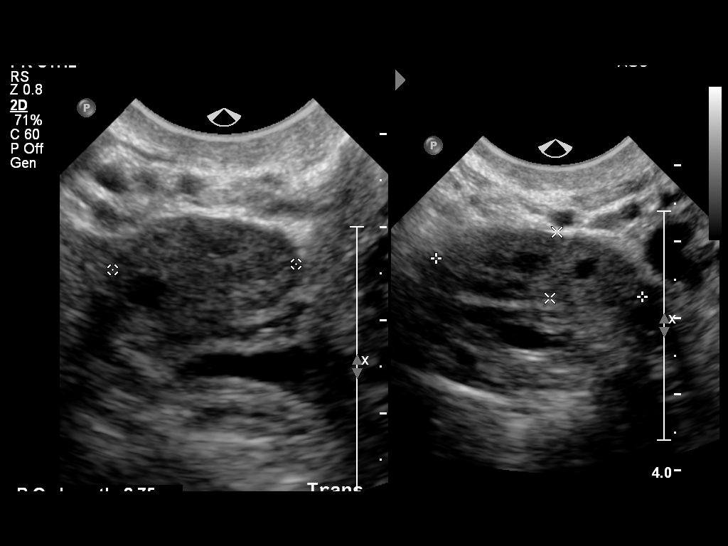

[14 of 28 positions shown; findings below may reference images not displayed]

FINDINGS: Intrauterine gestational sac: Visualized/normal in shape.

Yolk sac:  Visualized/normal in shape

Embryo:  Visualized

Cardiac Activity: Visualized

Heart Rate:  154 bpm

CRL:   10.2  mm   7 w 1 d                  US EDC: 09/03/2014

Maternal uterus/adnexae: Small subchorionic hemorrhage. Normal
appearing maternal ovaries with a left corpus luteum. No free
peritoneal fluid.
IMPRESSION: 1. Single live intrauterine gestation with an estimated gestational
age of 7 weeks 1 day.
2. Small subchorionic hemorrhage.

## 2016-03-20 ENCOUNTER — Other Ambulatory Visit: Payer: Self-pay | Admitting: Obstetrics and Gynecology

## 2016-03-20 DIAGNOSIS — O3680X0 Pregnancy with inconclusive fetal viability, not applicable or unspecified: Secondary | ICD-10-CM

## 2016-03-26 ENCOUNTER — Other Ambulatory Visit: Payer: Medicaid Other

## 2016-04-06 ENCOUNTER — Other Ambulatory Visit: Payer: Medicaid Other

## 2016-05-04 ENCOUNTER — Other Ambulatory Visit: Payer: Self-pay | Admitting: Obstetrics and Gynecology

## 2016-05-04 ENCOUNTER — Ambulatory Visit (INDEPENDENT_AMBULATORY_CARE_PROVIDER_SITE_OTHER): Payer: Medicaid Other

## 2016-05-04 ENCOUNTER — Other Ambulatory Visit: Payer: Medicaid Other

## 2016-05-04 DIAGNOSIS — O3412 Maternal care for benign tumor of corpus uteri, second trimester: Secondary | ICD-10-CM | POA: Diagnosis not present

## 2016-05-04 DIAGNOSIS — Z3A17 17 weeks gestation of pregnancy: Secondary | ICD-10-CM

## 2016-05-04 DIAGNOSIS — O3680X0 Pregnancy with inconclusive fetal viability, not applicable or unspecified: Secondary | ICD-10-CM

## 2016-05-04 NOTE — Progress Notes (Signed)
[place] 16+4 wks,single IUP,pos fht 154 bpm,normal ov's bilat,efw 143 g,post pl gr 0,cx 3.9 cm,two ant fibroids (#1) 2.9 x 1.9 x 2 cm,(#2) 3.2 x  3 x 1.8 cm,please have pt come back for anatomy ultrasound.

## 2016-05-17 ENCOUNTER — Encounter: Payer: Self-pay | Admitting: Advanced Practice Midwife

## 2016-05-17 ENCOUNTER — Ambulatory Visit (INDEPENDENT_AMBULATORY_CARE_PROVIDER_SITE_OTHER): Payer: Medicaid Other | Admitting: Advanced Practice Midwife

## 2016-05-17 VITALS — BP 82/54 | HR 64 | Wt 125.0 lb

## 2016-05-17 DIAGNOSIS — Z369 Encounter for antenatal screening, unspecified: Secondary | ICD-10-CM

## 2016-05-17 DIAGNOSIS — Z0283 Encounter for blood-alcohol and blood-drug test: Secondary | ICD-10-CM

## 2016-05-17 DIAGNOSIS — Z3492 Encounter for supervision of normal pregnancy, unspecified, second trimester: Secondary | ICD-10-CM

## 2016-05-17 DIAGNOSIS — Z3490 Encounter for supervision of normal pregnancy, unspecified, unspecified trimester: Secondary | ICD-10-CM | POA: Insufficient documentation

## 2016-05-17 DIAGNOSIS — Z363 Encounter for antenatal screening for malformations: Secondary | ICD-10-CM

## 2016-05-17 NOTE — Patient Instructions (Addendum)
Second Trimester of Pregnancy The second trimester is from week 13 through week 28, months 4 through 6. The second trimester is often a time when you feel your best. Your body has also adjusted to being pregnant, and you begin to feel better physically. Usually, morning sickness has lessened or quit completely, you may have more energy, and you may have an increase in appetite. The second trimester is also a time when the fetus is growing rapidly. At the end of the sixth month, the fetus is about 9 inches long and weighs about 1 pounds. You will likely begin to feel the baby move (quickening) between 18 and 20 weeks of the pregnancy. BODY CHANGES Your body goes through many changes during pregnancy. The changes vary from woman to woman.   Your weight will continue to increase. You will notice your lower abdomen bulging out.  You may begin to get stretch marks on your hips, abdomen, and breasts.  You may develop headaches that can be relieved by medicines approved by your health care provider.  You may urinate more often because the fetus is pressing on your bladder.  You may develop or continue to have heartburn as a result of your pregnancy.  You may develop constipation because certain hormones are causing the muscles that push waste through your intestines to slow down.  You may develop hemorrhoids or swollen, bulging veins (varicose veins).  You may have back pain because of the weight gain and pregnancy hormones relaxing your joints between the bones in your pelvis and as a result of a shift in weight and the muscles that support your balance.  Your breasts will continue to grow and be tender.  Your gums may bleed and may be sensitive to brushing and flossing.  Dark spots or blotches (chloasma, mask of pregnancy) may develop on your face. This will likely fade after the baby is born.  A dark line from your belly button to the pubic area (linea nigra) may appear. This will likely fade  after the baby is born.  You may have changes in your hair. These can include thickening of your hair, rapid growth, and changes in texture. Some women also have hair loss during or after pregnancy, or hair that feels dry or thin. Your hair will most likely return to normal after your baby is born. WHAT TO EXPECT AT YOUR PRENATAL VISITS During a routine prenatal visit:  You will be weighed to make sure you and the fetus are growing normally.  Your blood pressure will be taken.  Your abdomen will be measured to track your baby's growth.  The fetal heartbeat will be listened to.  Any test results from the previous visit will be discussed. Your health care provider may ask you:  How you are feeling.  If you are feeling the baby move.  If you have had any abnormal symptoms, such as leaking fluid, bleeding, severe headaches, or abdominal cramping.  If you are using any tobacco products, including cigarettes, chewing tobacco, and electronic cigarettes.  If you have any questions. Other tests that may be performed during your second trimester include:  Blood tests that check for:  Low iron levels (anemia).  Gestational diabetes (between 24 and 28 weeks).  Rh antibodies.  Urine tests to check for infections, diabetes, or protein in the urine.  An ultrasound to confirm the proper growth and development of the baby.  An amniocentesis to check for possible genetic problems.  Fetal screens for spina bifida   and Down syndrome.  HIV (human immunodeficiency virus) testing. Routine prenatal testing includes screening for HIV, unless you choose not to have this test. HOME CARE INSTRUCTIONS   Avoid all smoking, herbs, alcohol, and unprescribed drugs. These chemicals affect the formation and growth of the baby.  Do not use any tobacco products, including cigarettes, chewing tobacco, and electronic cigarettes. If you need help quitting, ask your health care provider. You may receive  counseling support and other resources to help you quit.  Follow your health care provider's instructions regarding medicine use. There are medicines that are either safe or unsafe to take during pregnancy.  Exercise only as directed by your health care provider. Experiencing uterine cramps is a good sign to stop exercising.  Continue to eat regular, healthy meals.  Wear a good support bra for breast tenderness.  Do not use hot tubs, steam rooms, or saunas.  Wear your seat belt at all times when driving.  Avoid raw meat, uncooked cheese, cat litter boxes, and soil used by cats. These carry germs that can cause birth defects in the baby.  Take your prenatal vitamins.  Take 1500-2000 mg of calcium daily starting at the 20th week of pregnancy until you deliver your baby.  Try taking a stool softener (if your health care provider approves) if you develop constipation. Eat more high-fiber foods, such as fresh vegetables or fruit and whole grains. Drink plenty of fluids to keep your urine clear or pale yellow.  Take warm sitz baths to soothe any pain or discomfort caused by hemorrhoids. Use hemorrhoid cream if your health care provider approves.  If you develop varicose veins, wear support hose. Elevate your feet for 15 minutes, 3-4 times a day. Limit salt in your diet.  Avoid heavy lifting, wear low heel shoes, and practice good posture.  Rest with your legs elevated if you have leg cramps or low back pain.  Visit your dentist if you have not gone yet during your pregnancy. Use a soft toothbrush to brush your teeth and be gentle when you floss.  A sexual relationship may be continued unless your health care provider directs you otherwise.  Continue to go to all your prenatal visits as directed by your health care provider. SEEK MEDICAL CARE IF:   You have dizziness.  You have mild pelvic cramps, pelvic pressure, or nagging pain in the abdominal area.  You have persistent nausea,  vomiting, or diarrhea.  You have a bad smelling vaginal discharge.  You have pain with urination. SEEK IMMEDIATE MEDICAL CARE IF:   You have a fever.  You are leaking fluid from your vagina.  You have spotting or bleeding from your vagina.  You have severe abdominal cramping or pain.  You have rapid weight gain or loss.  You have shortness of breath with chest pain.  You notice sudden or extreme swelling of your face, hands, ankles, feet, or legs.  You have not felt your baby move in over an hour.  You have severe headaches that do not go away with medicine.  You have vision changes.   This information is not intended to replace advice given to you by your health care provider. Make sure you discuss any questions you have with your health care provider.   Document Released: 08/14/2001 Document Revised: 09/10/2014 Document Reviewed: 10/21/2012 Elsevier Interactive Patient Education 2016 [place] Medications in Pregnancy   Acne: Benzoyl Peroxide Salicylic Acid  Backache/Headache: Tylenol: 2 regular strength every 4 hours  OR              2 Extra strength every 6 hours  Colds/Coughs/Allergies: Benadryl (alcohol free) 25 mg every 6 hours as needed Breath right strips Claritin Cepacol throat lozenges Chloraseptic throat spray Cold-Eeze- up to three times per day Cough drops, alcohol free Flonase (by prescription only) Guaifenesin Mucinex Robitussin DM (plain only, alcohol free) Saline nasal spray/drops Sudafed (pseudoephedrine) & Actifed ** use only after [redacted] weeks gestation and if you do not have high blood pressure Tylenol Vicks Vaporub Zinc lozenges Zyrtec   Constipation: Colace Ducolax suppositories Fleet enema Glycerin suppositories Metamucil Milk of magnesia Miralax Senokot Smooth move tea  Diarrhea: Kaopectate Imodium A-D  *NO pepto Bismol  Hemorrhoids: Anusol Anusol HC Preparation  H Tucks  Indigestion: Tums Maalox Mylanta Zantac  Pepcid  Insomnia: Benadryl (alcohol free) 25mg  every 6 hours as needed Tylenol PM Unisom, no Gelcaps  Leg Cramps: Tums MagGel  Nausea/Vomiting:  Bonine Dramamine Emetrol Ginger extract Sea bands Meclizine  Nausea medication to take during pregnancy:  Unisom (doxylamine succinate 25 mg tablets) Take one tablet daily at bedtime. If symptoms are not adequately controlled, the dose can be increased to a maximum recommended dose of two tablets daily (1/2 tablet in the morning, 1/2 tablet mid-afternoon and one at bedtime). Vitamin B6 100mg  tablets. Take one tablet twice a day (up to 200 mg per day).  Skin Rashes: Aveeno products Benadryl cream or 25mg  every 6 hours as needed Calamine Lotion 1% cortisone cream  Yeast infection: Gyne-lotrimin 7 Monistat 7   **If taking multiple medications, please check labels to avoid duplicating the same active ingredients **take medication as directed on the label ** Do not exceed 4000 mg of tylenol in 24 hours **Do not take medications that contain aspirin or ibuprofen

## 2016-05-17 NOTE — Progress Notes (Signed)
  Subjective:    Faith Allen is a [id number] [redacted]w[redacted]d being seen today for her first obstetrical visit.  Her obstetrical history is significant for 3 term SVDs w/o problems.  Pregnancy history fully reviewed.  Patient reports no problems.  Vitals:   05/17/16 1348  BP: (!) 82/54  Pulse: 64  Weight: 125 lb (56.7 kg)    HISTORY: OB History  Gravida Para Term Preterm AB Living  4 3 3     3   SAB TAB Ectopic Multiple Live Births        0 3    # Outcome Date GA Lbr Len/2nd Weight Sex Delivery Anes PTL Lv  4 Current           3 Term 08/21/14 [redacted]w[redacted]d 06:08 / 00:34 6 lb 4.9 oz (2.86 kg) M Vag-Spont EPI  LIV  2 Term 02/04/10 [redacted]w[redacted]d  5 lb 7 oz (2.466 kg) F Vag-Spont None  LIV  1 Term 06/19/08 [redacted]w[redacted]d  5 lb 5 oz (2.41 kg) M Vag-Spont EPI  LIV     Past Medical History:  Diagnosis Date  . Medical history non-contributory    Past Surgical History:  Procedure Laterality Date  . NO PAST SURGERIES     Family History  Problem Relation Age of Onset  . Diabetes Mother   . Miscarriages / Stillbirths Sister   . Irritable bowel syndrome Sister   . Inflammatory bowel disease Neg Hx   . Colon cancer Neg Hx      Exam                                      System:     Skin: normal coloration and turgor, no rashes    Neurologic: oriented, normal, normal mood   Extremities: normal strength, tone, and muscle mass   HEENT PERRLA   Mouth/Teeth mucous membranes moist, normal dentition   Neck supple and no masses   Cardiovascular: regular ratenormal and rhythm   Respiratory:  appears well, vitals normal, no respiratory distress, acyanotic   Abdomen: soft, non-tender;  FHR: 150 [place]          Assessment:    Pregnancy: [id number] Patient Active Problem List   Diagnosis Date Noted  . Constipation 12/31/2014  . Rectal bleeding 12/31/2014  . Susceptible to varicella (non-immune), currently pregnant 03/06/2014  . Supervision of other normal pregnancy 03/03/2014  . Uterine fibroid in pregnancy  03/03/2014        Plan:     Initial labs drawn. Continue prenatal vitamins  Problem list reviewed and updated  Reviewed n/v relief measures and warning s/s to report  Reviewed recommended weight gain based on pre-gravid BMI  Encouraged well-balanced diet Genetic Screening discussed Quad Screen: requested.  Ultrasound discussed; fetal survey: requested.  Return in about 2 weeks (around 05/31/2016) for LROB, [id number].  CRESENZO-DISHMAN,Payton Prinsen 05/17/2016

## 2016-05-18 LAB — PMP SCREEN PROFILE (10S), URINE
AMPHETAMINE SCRN UR: NEGATIVE ng/mL
BENZODIAZEPINE SCREEN, URINE: NEGATIVE ng/mL
Barbiturate Screen, Ur: NEGATIVE ng/mL
CANNABINOIDS UR QL SCN: NEGATIVE ng/mL
COCAINE(METAB.) SCREEN, URINE: NEGATIVE ng/mL
Creatinine(Crt), U: 54.7 mg/dL (ref 20.0–300.0)
Methadone Scn, Ur: NEGATIVE ng/mL
Opiate Scrn, Ur: NEGATIVE ng/mL
Oxycodone+Oxymorphone Ur Ql Scn: NEGATIVE ng/mL
PCP SCRN UR: NEGATIVE ng/mL
PH UR, DRUG SCRN: 6.6 (ref 4.5–8.9)
Propoxyphene, Screen: NEGATIVE ng/mL

## 2016-05-18 LAB — HIV ANTIBODY (ROUTINE TESTING W REFLEX): HIV Screen 4th Generation wRfx: NONREACTIVE

## 2016-05-18 LAB — RPR: RPR: NONREACTIVE

## 2016-05-18 LAB — CBC
HEMATOCRIT: 36.7 % (ref 34.0–46.6)
Hemoglobin: 12.5 g/dL (ref 11.1–15.9)
MCH: 28.3 pg (ref 26.6–33.0)
MCHC: 34.1 g/dL (ref 31.5–35.7)
MCV: 83 fL (ref 79–97)
PLATELETS: 283 10*3/uL (ref 150–379)
RBC: 4.41 x10E6/uL (ref 3.77–5.28)
RDW: 13.9 % (ref 12.3–15.4)
WBC: 12.6 10*3/uL — AB (ref 3.4–10.8)

## 2016-05-18 LAB — VARICELLA ZOSTER ANTIBODY, IGG

## 2016-05-18 LAB — ANTIBODY SCREEN: Antibody Screen: NEGATIVE

## 2016-05-18 LAB — HEPATITIS B SURFACE ANTIGEN: Hepatitis B Surface Ag: NEGATIVE

## 2016-06-01 ENCOUNTER — Encounter: Payer: Self-pay | Admitting: Obstetrics and Gynecology

## 2016-06-01 ENCOUNTER — Ambulatory Visit (INDEPENDENT_AMBULATORY_CARE_PROVIDER_SITE_OTHER): Payer: Medicaid Other | Admitting: Obstetrics and Gynecology

## 2016-06-01 ENCOUNTER — Ambulatory Visit (INDEPENDENT_AMBULATORY_CARE_PROVIDER_SITE_OTHER): Payer: Medicaid Other

## 2016-06-01 VITALS — BP 90/50 | HR 72 | Wt 128.8 lb

## 2016-06-01 DIAGNOSIS — Z36 Encounter for antenatal screening of mother: Secondary | ICD-10-CM | POA: Diagnosis not present

## 2016-06-01 DIAGNOSIS — Z1389 Encounter for screening for other disorder: Secondary | ICD-10-CM

## 2016-06-01 DIAGNOSIS — Z3492 Encounter for supervision of normal pregnancy, unspecified, second trimester: Secondary | ICD-10-CM

## 2016-06-01 DIAGNOSIS — Z3A21 21 weeks gestation of pregnancy: Secondary | ICD-10-CM | POA: Diagnosis not present

## 2016-06-01 DIAGNOSIS — Z363 Encounter for antenatal screening for malformations: Secondary | ICD-10-CM

## 2016-06-01 DIAGNOSIS — Z331 Pregnant state, incidental: Secondary | ICD-10-CM

## 2016-06-01 LAB — POCT URINALYSIS DIPSTICK
Blood, UA: NEGATIVE
Glucose, UA: NEGATIVE
KETONES UA: NEGATIVE
NITRITE UA: NEGATIVE
PROTEIN UA: NEGATIVE

## 2016-06-01 NOTE — Progress Notes (Signed)
[place] [id number] wks,cephalic,efw [id number] g,normal ov's bilat,svp of fluid 5.3cm,post pl gr 0,cx 3.4 cm,fhr 133 bpm,anatomy complete,no obvious abnormalities seen

## 2016-06-01 NOTE — Progress Notes (Addendum)
Faith Allen is a 30 y.o. female  [id number]  Estimated Date of Delivery: 10/15/16 LROB [redacted]w[redacted]d  Blood pressure (!) 90/50, pulse 72, weight 128 lb 12.8 oz (58.4 kg), currently breastfeeding.   Urine results:notable for small leukocytes  Chief Complaint  Patient presents with  . Routine Prenatal Visit    ultrasound today    Patient has no acute complaints at this time.  Patient reports  good fetal movement,                           denies any bleeding , rupture of membranes,or regular contractions.   refer to the Ultrasound and the OB  flow sheet for FH and FHR, , ultrasound shows normal anatomy singleton fetus                        Physical Examination: General appearance - alert, well appearing, and in no distress and oriented to person, place, and time                                      Abdomen - soft, nontender, nondistended, no masses or organomegaly                                      Pelvic - examination not indicated                                            Questions were answered. Assessment: LROB [id number] @ [redacted]w[redacted]d   Plan:  Continued routine obstetrical care            BC_ BTL F/u in 4 weeks for routine OB care.  Discussed Timing of tubal ligation, and patient is ambivalent at this time whether she wants the tubal at 4 weeks or while in hospital.  By signing my name below, I, Evelene Croon, attest that this documentation has been prepared under the direction and in the presence of Jonnie Kind, MD . Electronically Signed: Evelene Croon, Scribe. 06/01/2016. 10:16 AM. I personally performed the services described in this documentation, which was SCRIBED in my presence. The recorded information has been reviewed and considered accurate. It has been edited as necessary during review. Jonnie Kind, MD

## 2016-06-29 ENCOUNTER — Encounter: Payer: Medicaid Other | Admitting: Obstetrics and Gynecology

## 2016-07-06 ENCOUNTER — Encounter: Payer: Medicaid Other | Admitting: Obstetrics & Gynecology

## 2016-08-07 ENCOUNTER — Ambulatory Visit (INDEPENDENT_AMBULATORY_CARE_PROVIDER_SITE_OTHER): Payer: Medicaid Other | Admitting: Advanced Practice Midwife

## 2016-08-07 ENCOUNTER — Encounter: Payer: Self-pay | Admitting: Advanced Practice Midwife

## 2016-08-07 VITALS — BP 98/50 | HR 68 | Wt 142.0 lb

## 2016-08-07 DIAGNOSIS — Z1389 Encounter for screening for other disorder: Secondary | ICD-10-CM

## 2016-08-07 DIAGNOSIS — Z3A30 30 weeks gestation of pregnancy: Secondary | ICD-10-CM

## 2016-08-07 DIAGNOSIS — Z3483 Encounter for supervision of other normal pregnancy, third trimester: Secondary | ICD-10-CM

## 2016-08-07 DIAGNOSIS — O0933 Supervision of pregnancy with insufficient antenatal care, third trimester: Secondary | ICD-10-CM

## 2016-08-07 DIAGNOSIS — Z331 Pregnant state, incidental: Secondary | ICD-10-CM

## 2016-08-07 DIAGNOSIS — O0932 Supervision of pregnancy with insufficient antenatal care, second trimester: Secondary | ICD-10-CM

## 2016-08-07 LAB — POCT URINALYSIS DIPSTICK
Blood, UA: NEGATIVE
Glucose, UA: NEGATIVE
KETONES UA: NEGATIVE
Nitrite, UA: NEGATIVE
PROTEIN UA: NEGATIVE

## 2016-08-07 NOTE — Patient Instructions (Addendum)
Third Trimester of Pregnancy The third trimester is from week 29 through week 40 (months 7 through 9). The third trimester is a time when the unborn baby (fetus) is growing rapidly. At the end of the ninth month, the fetus is about 20 inches in length and weighs 6-10 pounds. Body changes during your third trimester Your body goes through many changes during pregnancy. The changes vary from woman to woman. During the third trimester:  Your weight will continue to increase. You can expect to gain 25-35 pounds (11-16 kg) by the end of the pregnancy.  You may begin to get stretch marks on your hips, abdomen, and breasts.  You may urinate more often because the fetus is moving lower into your pelvis and pressing on your bladder.  You may develop or continue to have heartburn. This is caused by increased hormones that slow down muscles in the digestive tract.  You may develop or continue to have constipation because increased hormones slow digestion and cause the muscles that push waste through your intestines to relax.  You may develop hemorrhoids. These are swollen veins (varicose veins) in the rectum that can itch or be painful.  You may develop swollen, bulging veins (varicose veins) in your legs.  You may have increased body aches in the pelvis, back, or thighs. This is due to weight gain and increased hormones that are relaxing your joints.  You may have changes in your hair. These can include thickening of your hair, rapid growth, and changes in texture. Some women also have hair loss during or after pregnancy, or hair that feels dry or thin. Your hair will most likely return to normal after your baby is born.  Your breasts will continue to grow and they will continue to become tender. A yellow fluid (colostrum) may leak from your breasts. This is the first milk you are producing for your baby.  Your belly button may stick out.  You may notice more swelling in your hands, face, or  ankles.  You may have increased tingling or numbness in your hands, arms, and legs. The skin on your belly may also feel numb.  You may feel short of breath because of your expanding uterus.  You may have more problems sleeping. This can be caused by the size of your belly, increased need to urinate, and an increase in your body's metabolism.  You may notice the fetus "dropping," or moving lower in your abdomen.  You may have increased vaginal discharge.  Your cervix becomes thin and soft (effaced) near your due date. What to expect at prenatal visits You will have prenatal exams every 2 weeks until week 36. Then you will have weekly prenatal exams. During a routine prenatal visit:  You will be weighed to make sure you and the fetus are growing normally.  Your blood pressure will be taken.  Your abdomen will be measured to track your baby's growth.  The fetal heartbeat will be listened to.  Any test results from the previous visit will be discussed.  You may have a cervical check near your due date to see if you have effaced. At around 36 weeks, your health care provider will check your cervix. At the same time, your health care provider will also perform a test on the secretions of the vaginal tissue. This test is to determine if a type of bacteria, Group B streptococcus, is present. Your health care provider will explain this further. Your health care provider may ask you:    What your birth plan is.  How you are feeling.  If you are feeling the baby move.  If you have had any abnormal symptoms, such as leaking fluid, bleeding, severe headaches, or abdominal cramping.  If you are using any tobacco products, including cigarettes, chewing tobacco, and electronic cigarettes.  If you have any questions. Other tests or screenings that may be performed during your third trimester include:  Blood tests that check for low iron levels (anemia).  Fetal testing to check the health,  activity level, and growth of the fetus. Testing is done if you have certain medical conditions or if there are problems during the pregnancy.  Nonstress test (NST). This test checks the health of your baby to make sure there are no signs of problems, such as the baby not getting enough oxygen. During this test, a belt is placed around your belly. The baby is made to move, and its heart rate is monitored during movement. What is false labor? False labor is a condition in which you feel small, irregular tightenings of the muscles in the womb (contractions) that eventually go away. These are called Braxton Hicks contractions. Contractions may last for hours, days, or even weeks before true labor sets in. If contractions come at regular intervals, become more frequent, increase in intensity, or become painful, you should see your health care provider. What are the signs of labor?  Abdominal cramps.  Regular contractions that start at 10 minutes apart and become stronger and more frequent with time.  Contractions that start on the top of the uterus and spread down to the lower abdomen and back.  Increased pelvic pressure and dull back pain.  A watery or bloody mucus discharge that comes from the vagina.  Leaking of amniotic fluid. This is also known as your "water breaking." It could be a slow trickle or a gush. Let your doctor know if it has a color or strange odor. If you have any of these signs, call your health care provider right away, even if it is before your due date. Follow these instructions at home: Eating and drinking  Continue to eat regular, healthy meals.  Do not eat:  Raw meat or meat spreads.  Unpasteurized milk or cheese.  Unpasteurized juice.  Store-made salad.  Refrigerated smoked seafood.  Hot dogs or deli meat, unless they are piping hot.  More than 6 ounces of albacore tuna a week.  Shark, swordfish, king mackerel, or tile fish.  Store-made salads.  Raw  sprouts, such as mung bean or alfalfa sprouts.  Take prenatal vitamins as told by your health care provider.  Take 1000 mg of calcium daily as told by your health care provider.  If you develop constipation:  Take over-the-counter or prescription medicines.  Drink enough fluid to keep your urine clear or pale yellow.  Eat foods that are high in fiber, such as fresh fruits and vegetables, whole grains, and beans.  Limit foods that are high in fat and processed sugars, such as fried and sweet foods. Activity  Exercise only as directed by your health care provider. Healthy pregnant women should aim for 2 hours and 30 minutes of moderate exercise per week. If you experience any pain or discomfort while exercising, stop.  Avoid heavy lifting.  Do not exercise in extreme heat or humidity, or at high altitudes.  Wear low-heel, comfortable shoes.  Practice good posture.  Do not travel far distances unless it is absolutely necessary and only with the approval   of your health care provider.  Wear your seat belt at all times while in a car, on a bus, or on a plane.  Take frequent breaks and rest with your legs elevated if you have leg cramps or low back pain.  Do not use hot tubs, steam rooms, or saunas.  You may continue to have sex unless your health care provider tells you otherwise. Lifestyle  Do not use any products that contain nicotine or tobacco, such as cigarettes and e-cigarettes. If you need help quitting, ask your health care provider.  Do not drink alcohol.  Do not use any medicinal herbs or unprescribed drugs. These chemicals affect the formation and growth of the baby.  If you develop varicose veins:  Wear support pantyhose or compression stockings as told by your healthcare provider.  Elevate your feet for 15 minutes, 3-4 times a day.  Wear a supportive maternity bra to help with breast tenderness. General instructions  Take over-the-counter and prescription  medicines only as told by your health care provider. There are medicines that are either safe or unsafe to take during pregnancy.  Take warm sitz baths to soothe any pain or discomfort caused by hemorrhoids. Use hemorrhoid cream or witch hazel if your health care provider approves.  Avoid cat litter boxes and soil used by cats. These carry germs that can cause birth defects in the baby. If you have a cat, ask someone to clean the litter box for you.  To prepare for the arrival of your baby:  Take prenatal classes to understand, practice, and ask questions about the labor and delivery.  Make a trial run to the hospital.  Visit the hospital and tour the maternity area.  Arrange for maternity or paternity leave through employers.  Arrange for family and friends to take care of pets while you are in the hospital.  Purchase a rear-facing car seat and make sure you know how to install it in your car.  Pack your hospital bag.  Prepare the baby's nursery. Make sure to remove all pillows and stuffed animals from the baby's crib to prevent suffocation.  Visit your dentist if you have not gone during your pregnancy. Use a soft toothbrush to brush your teeth and be gentle when you floss.  Keep all prenatal follow-up visits as told by your health care provider. This is important. Contact a health care provider if:  You are unsure if you are in labor or if your water has broken.  You become dizzy.  You have mild pelvic cramps, pelvic pressure, or nagging pain in your abdominal area.  You have lower back pain.  You have persistent nausea, vomiting, or diarrhea.  You have an unusual or bad smelling vaginal discharge.  You have pain when you urinate. Get help right away if:  You have a fever.  You are leaking fluid from your vagina.  You have spotting or bleeding from your vagina.  You have severe abdominal pain or cramping.  You have rapid weight loss or weight gain.  You have  shortness of breath with chest pain.  You notice sudden or extreme swelling of your face, hands, ankles, feet, or legs.  Your baby makes fewer than 10 movements in 2 hours.  You have severe headaches that do not go away with medicine.  You have vision changes. Summary  The third trimester is from week 29 through week 40, months 7 through 9. The third trimester is a time when the unborn baby (fetus)   is growing rapidly.  During the third trimester, your discomfort may increase as you and your baby continue to gain weight. You may have abdominal, leg, and back pain, sleeping problems, and an increased need to urinate.  During the third trimester your breasts will keep growing and they will continue to become tender. A yellow fluid (colostrum) may leak from your breasts. This is the first milk you are producing for your baby.  False labor is a condition in which you feel small, irregular tightenings of the muscles in the womb (contractions) that eventually go away. These are called Braxton Hicks contractions. Contractions may last for hours, days, or even weeks before true labor sets in.  Signs of labor can include: abdominal cramps; regular contractions that start at 10 minutes apart and become stronger and more frequent with time; watery or bloody mucus discharge that comes from the vagina; increased pelvic pressure and dull back pain; and leaking of amniotic fluid. This information is not intended to replace advice given to you by your health care provider. Make sure you discuss any questions you have with your health care provider. Document Released: 08/14/2001 Document Revised: 01/26/2016 Document Reviewed: 10/21/2012 Elsevier Interactive Patient Education  2017 [employer].   1. Before your test, do not eat or drink anything for 8-10 hours prior to your  appointment (a small amount of water is allowed and you may take any medicines you normally take). Be sure to drink lots of water the  day before. 2. When you arrive, your blood will be drawn for a 'fasting' blood sugar level.  Then you will be given a sweetened carbonated beverage to drink. You should  complete drinking this beverage within five minutes. After finishing the  beverage, you will have your blood drawn exactly 1 and 2 hours later. Having  your blood drawn on time is an important part of this test. A total of three blood  samples will be done. 3. The test takes approximately 2  hours. During the test, do not have anything to  eat or drink. Do not smoke, chew gum (not even sugarless gum) or use breath mints.  4. During the test you should remain close by and seated as much as possible and  avoid walking around. You may want to bring a book or something else to  occupy your time.  5. After your test, you may eat and drink as normal. You may want to bring a snack  to eat after the test is finished. Your provider will advise you as to the results of  this test and any follow-up if necessary  If your sugar test is positive for gestational diabetes, you will be given an phone call and further instructions discussed. If you wish to know all of your test results before your next appointment, feel free to call the office, or look up your test results on Mychart.  (The range that the lab uses for normal values of the sugar test are not necessarily the range that is used for pregnant women; if your results are within the normal range, they are definitely normal.  However, if a value is deemed "high" by the lab, it may not be too high for a pregnant woman.  We will need to discuss the results if your value(s) fall in the "high" category).     Tdap Vaccine  It is recommended that you get the Tdap vaccine during the third trimester of EACH pregnancy to help protect your baby  from getting pertussis (whooping cough)  27-36 weeks is the BEST time to do this so that you can pass the protection on to your baby. During pregnancy is  better than after pregnancy, but if you are unable to get it during pregnancy it will be offered at the hospital.  You can get this vaccine at the health department or your family doctor, as well as some pharmacies.  Everyone who will be around your baby should also be up-to-date on their vaccines. Adults (who are not pregnant) only need 1 dose of Tdap during adulthood.

## 2016-08-07 NOTE — Progress Notes (Signed)
[id number] [redacted]w[redacted]d Estimated Date of Delivery: 10/15/16  Blood pressure (!) 98/50, pulse 68, weight 142 lb (64.4 kg), currently breastfeeding.   Hasn't been here in 10 weeks--she said she thought everything was going well and she felt so good that she didn't think she needed to come.    BP weight and urine results all reviewed and noted.  Please refer to the obstetrical flow sheet for the fundal height and fetal heart rate documentation:  Patient reports good fetal movement, denies any bleeding and no rupture of membranes symptoms or regular contractions. Patient is without complaints. All questions were answered.  Orders Placed This Encounter  Procedures  . POCT urinalysis dipstick    Plan:  Continued routine obstetrical care, encouraged to keep appointments from this point on  Return in about 2 weeks (around 08/21/2016) for [place] and Friday for PN2 only.

## 2016-08-10 ENCOUNTER — Other Ambulatory Visit: Payer: Medicaid Other

## 2016-08-10 DIAGNOSIS — Z131 Encounter for screening for diabetes mellitus: Secondary | ICD-10-CM

## 2016-08-10 DIAGNOSIS — Z3483 Encounter for supervision of other normal pregnancy, third trimester: Secondary | ICD-10-CM

## 2016-08-11 LAB — HIV ANTIBODY (ROUTINE TESTING W REFLEX): HIV SCREEN 4TH GENERATION: NONREACTIVE

## 2016-08-11 LAB — GLUCOSE TOLERANCE, 2 HOURS W/ 1HR
GLUCOSE, 1 HOUR: 98 mg/dL (ref 65–179)
GLUCOSE, FASTING: 82 mg/dL (ref 65–91)
Glucose, 2 hour: 91 mg/dL (ref 65–152)

## 2016-08-11 LAB — CBC
HEMATOCRIT: 32.5 % — AB (ref 34.0–46.6)
HEMOGLOBIN: 10.7 g/dL — AB (ref 11.1–15.9)
MCH: 25.7 pg — AB (ref 26.6–33.0)
MCHC: 32.9 g/dL (ref 31.5–35.7)
MCV: 78 fL — ABNORMAL LOW (ref 79–97)
PLATELETS: 238 10*3/uL (ref 150–379)
RBC: 4.16 x10E6/uL (ref 3.77–5.28)
RDW: 13.2 % (ref 12.3–15.4)
WBC: 9.8 10*3/uL (ref 3.4–10.8)

## 2016-08-11 LAB — ANTIBODY SCREEN: Antibody Screen: NEGATIVE

## 2016-08-11 LAB — RPR: RPR: NONREACTIVE

## 2016-08-23 ENCOUNTER — Encounter: Payer: Self-pay | Admitting: Advanced Practice Midwife

## 2016-08-23 ENCOUNTER — Ambulatory Visit (INDEPENDENT_AMBULATORY_CARE_PROVIDER_SITE_OTHER): Payer: Medicaid Other | Admitting: Advanced Practice Midwife

## 2016-08-23 VITALS — BP 100/70 | HR 68 | Wt 143.5 lb

## 2016-08-23 DIAGNOSIS — Z3483 Encounter for supervision of other normal pregnancy, third trimester: Secondary | ICD-10-CM

## 2016-08-23 DIAGNOSIS — Z3A33 33 weeks gestation of pregnancy: Secondary | ICD-10-CM

## 2016-08-23 NOTE — Progress Notes (Signed)
[id number] [redacted]w[redacted]d Estimated Date of Delivery: 10/15/16  Blood pressure 100/70, pulse 68, weight 143 lb 8 oz (65.1 kg), currently breastfeeding.   BP weight and urine results all reviewed and noted.  Please refer to the obstetrical flow sheet for the fundal height and fetal heart rate documentation:  Patient reports good fetal movement, denies any bleeding and no rupture of membranes symptoms or regular contractions. Patient is without complaints. All questions were answered.  No orders of the defined types were placed in this encounter.   Plan:  Continued routine obstetrical care,   Return in about 2 weeks (around 09/06/2016) for [place].

## 2016-08-23 NOTE — Patient Instructions (Signed)
Third Trimester of Pregnancy The third trimester is from week 29 through week 40 (months 7 through 9). The third trimester is a time when the unborn baby (fetus) is growing rapidly. At the end of the ninth month, the fetus is about 20 inches in length and weighs 6-10 pounds. Body changes during your third trimester Your body goes through many changes during pregnancy. The changes vary from woman to woman. During the third trimester:  Your weight will continue to increase. You can expect to gain 25-35 pounds (11-16 kg) by the end of the pregnancy.  You may begin to get stretch marks on your hips, abdomen, and breasts.  You may urinate more often because the fetus is moving lower into your pelvis and pressing on your bladder.  You may develop or continue to have heartburn. This is caused by increased hormones that slow down muscles in the digestive tract.  You may develop or continue to have constipation because increased hormones slow digestion and cause the muscles that push waste through your intestines to relax.  You may develop hemorrhoids. These are swollen veins (varicose veins) in the rectum that can itch or be painful.  You may develop swollen, bulging veins (varicose veins) in your legs.  You may have increased body aches in the pelvis, back, or thighs. This is due to weight gain and increased hormones that are relaxing your joints.  You may have changes in your hair. These can include thickening of your hair, rapid growth, and changes in texture. Some women also have hair loss during or after pregnancy, or hair that feels dry or thin. Your hair will most likely return to normal after your baby is born.  Your breasts will continue to grow and they will continue to become tender. A yellow fluid (colostrum) may leak from your breasts. This is the first milk you are producing for your baby.  Your belly button may stick out.  You may notice more swelling in your hands, face, or  ankles.  You may have increased tingling or numbness in your hands, arms, and legs. The skin on your belly may also feel numb.  You may feel short of breath because of your expanding uterus.  You may have more problems sleeping. This can be caused by the size of your belly, increased need to urinate, and an increase in your body's metabolism.  You may notice the fetus "dropping," or moving lower in your abdomen.  You may have increased vaginal discharge.  Your cervix becomes thin and soft (effaced) near your due date. What to expect at prenatal visits You will have prenatal exams every 2 weeks until week 36. Then you will have weekly prenatal exams. During a routine prenatal visit:  You will be weighed to make sure you and the fetus are growing normally.  Your blood pressure will be taken.  Your abdomen will be measured to track your baby's growth.  The fetal heartbeat will be listened to.  Any test results from the previous visit will be discussed.  You may have a cervical check near your due date to see if you have effaced. At around 36 weeks, your health care provider will check your cervix. At the same time, your health care provider will also perform a test on the secretions of the vaginal tissue. This test is to determine if a type of bacteria, Group B streptococcus, is present. Your health care provider will explain this further. Your health care provider may ask you:    What your birth plan is.  How you are feeling.  If you are feeling the baby move.  If you have had any abnormal symptoms, such as leaking fluid, bleeding, severe headaches, or abdominal cramping.  If you are using any tobacco products, including cigarettes, chewing tobacco, and electronic cigarettes.  If you have any questions. Other tests or screenings that may be performed during your third trimester include:  Blood tests that check for low iron levels (anemia).  Fetal testing to check the health,  activity level, and growth of the fetus. Testing is done if you have certain medical conditions or if there are problems during the pregnancy.  Nonstress test (NST). This test checks the health of your baby to make sure there are no signs of problems, such as the baby not getting enough oxygen. During this test, a belt is placed around your belly. The baby is made to move, and its heart rate is monitored during movement. What is false labor? False labor is a condition in which you feel small, irregular tightenings of the muscles in the womb (contractions) that eventually go away. These are called Braxton Hicks contractions. Contractions may last for hours, days, or even weeks before true labor sets in. If contractions come at regular intervals, become more frequent, increase in intensity, or become painful, you should see your health care provider. What are the signs of labor?  Abdominal cramps.  Regular contractions that start at 10 minutes apart and become stronger and more frequent with time.  Contractions that start on the top of the uterus and spread down to the lower abdomen and back.  Increased pelvic pressure and dull back pain.  A watery or bloody mucus discharge that comes from the vagina.  Leaking of amniotic fluid. This is also known as your "water breaking." It could be a slow trickle or a gush. Let your doctor know if it has a color or strange odor. If you have any of these signs, call your health care provider right away, even if it is before your due date. Follow these instructions at home: Eating and drinking  Continue to eat regular, healthy meals.  Do not eat:  Raw meat or meat spreads.  Unpasteurized milk or cheese.  Unpasteurized juice.  Store-made salad.  Refrigerated smoked seafood.  Hot dogs or deli meat, unless they are piping hot.  More than 6 ounces of albacore tuna a week.  Shark, swordfish, king mackerel, or tile fish.  Store-made salads.  Raw  sprouts, such as mung bean or alfalfa sprouts.  Take prenatal vitamins as told by your health care provider.  Take 1000 mg of calcium daily as told by your health care provider.  If you develop constipation:  Take over-the-counter or prescription medicines.  Drink enough fluid to keep your urine clear or pale yellow.  Eat foods that are high in fiber, such as fresh fruits and vegetables, whole grains, and beans.  Limit foods that are high in fat and processed sugars, such as fried and sweet foods. Activity  Exercise only as directed by your health care provider. Healthy pregnant women should aim for 2 hours and 30 minutes of moderate exercise per week. If you experience any pain or discomfort while exercising, stop.  Avoid heavy lifting.  Do not exercise in extreme heat or humidity, or at high altitudes.  Wear low-heel, comfortable shoes.  Practice good posture.  Do not travel far distances unless it is absolutely necessary and only with the approval   of your health care provider.  Wear your seat belt at all times while in a car, on a bus, or on a plane.  Take frequent breaks and rest with your legs elevated if you have leg cramps or low back pain.  Do not use hot tubs, steam rooms, or saunas.  You may continue to have sex unless your health care provider tells you otherwise. Lifestyle  Do not use any products that contain nicotine or tobacco, such as cigarettes and e-cigarettes. If you need help quitting, ask your health care provider.  Do not drink alcohol.  Do not use any medicinal herbs or unprescribed drugs. These chemicals affect the formation and growth of the baby.  If you develop varicose veins:  Wear support pantyhose or compression stockings as told by your healthcare provider.  Elevate your feet for 15 minutes, 3-4 times a day.  Wear a supportive maternity bra to help with breast tenderness. General instructions  Take over-the-counter and prescription  medicines only as told by your health care provider. There are medicines that are either safe or unsafe to take during pregnancy.  Take warm sitz baths to soothe any pain or discomfort caused by hemorrhoids. Use hemorrhoid cream or witch hazel if your health care provider approves.  Avoid cat litter boxes and soil used by cats. These carry germs that can cause birth defects in the baby. If you have a cat, ask someone to clean the litter box for you.  To prepare for the arrival of your baby:  Take prenatal classes to understand, practice, and ask questions about the labor and delivery.  Make a trial run to the hospital.  Visit the hospital and tour the maternity area.  Arrange for maternity or paternity leave through employers.  Arrange for family and friends to take care of pets while you are in the hospital.  Purchase a rear-facing car seat and make sure you know how to install it in your car.  Pack your hospital bag.  Prepare the baby's nursery. Make sure to remove all pillows and stuffed animals from the baby's crib to prevent suffocation.  Visit your dentist if you have not gone during your pregnancy. Use a soft toothbrush to brush your teeth and be gentle when you floss.  Keep all prenatal follow-up visits as told by your health care provider. This is important. Contact a health care provider if:  You are unsure if you are in labor or if your water has broken.  You become dizzy.  You have mild pelvic cramps, pelvic pressure, or nagging pain in your abdominal area.  You have lower back pain.  You have persistent nausea, vomiting, or diarrhea.  You have an unusual or bad smelling vaginal discharge.  You have pain when you urinate. Get help right away if:  You have a fever.  You are leaking fluid from your vagina.  You have spotting or bleeding from your vagina.  You have severe abdominal pain or cramping.  You have rapid weight loss or weight gain.  You have  shortness of breath with chest pain.  You notice sudden or extreme swelling of your face, hands, ankles, feet, or legs.  Your baby makes fewer than 10 movements in 2 hours.  You have severe headaches that do not go away with medicine.  You have vision changes. Summary  The third trimester is from week 29 through week 40, months 7 through 9. The third trimester is a time when the unborn baby (fetus)   is growing rapidly.  During the third trimester, your discomfort may increase as you and your baby continue to gain weight. You may have abdominal, leg, and back pain, sleeping problems, and an increased need to urinate.  During the third trimester your breasts will keep growing and they will continue to become tender. A yellow fluid (colostrum) may leak from your breasts. This is the first milk you are producing for your baby.  False labor is a condition in which you feel small, irregular tightenings of the muscles in the womb (contractions) that eventually go away. These are called Braxton Hicks contractions. Contractions may last for hours, days, or even weeks before true labor sets in.  Signs of labor can include: abdominal cramps; regular contractions that start at 10 minutes apart and become stronger and more frequent with time; watery or bloody mucus discharge that comes from the vagina; increased pelvic pressure and dull back pain; and leaking of amniotic fluid. This information is not intended to replace advice given to you by your health care provider. Make sure you discuss any questions you have with your health care provider. Document Released: 08/14/2001 Document Revised: 01/26/2016 Document Reviewed: 10/21/2012 Elsevier Interactive Patient Education  2017 [employer]  

## 2016-09-03 NOTE — L&D Delivery Note (Signed)
31 y.o. G4P3003 at [redacted]w[redacted]d delivered a viable female infant in cephalic, LOA position. Anterior shoulder delivered with ease. 60 sec delayed cord clamping. Cord clamped x2 and cut. Placenta delivered spontaneously intact, with 3VC. Fundus firm on exam with massage and pitocin. Good hemostasis noted.  Laceration: None Suture: none Good hemostasis noted.  Mom and baby recovering in LDR.  EBL 300cc  Apgars: 8/9 Weight:    Faith Levels, MD PGY-1 10/06/2016, 7:14 PM   OB FELLOW DELIVERY ATTESTATION  I was gloved and present for the delivery in its entirety, and I agree with the above resident's note.    Faith Doe, MD 7:20 PM

## 2016-09-07 ENCOUNTER — Ambulatory Visit (INDEPENDENT_AMBULATORY_CARE_PROVIDER_SITE_OTHER): Payer: Medicaid Other | Admitting: Women's Health

## 2016-09-07 ENCOUNTER — Encounter: Payer: Self-pay | Admitting: Women's Health

## 2016-09-07 ENCOUNTER — Telehealth: Payer: Self-pay | Admitting: *Deleted

## 2016-09-07 VITALS — BP 107/66 | HR 69 | Wt 147.6 lb

## 2016-09-07 DIAGNOSIS — Z1389 Encounter for screening for other disorder: Secondary | ICD-10-CM

## 2016-09-07 DIAGNOSIS — Z331 Pregnant state, incidental: Secondary | ICD-10-CM

## 2016-09-07 DIAGNOSIS — Z3A35 35 weeks gestation of pregnancy: Secondary | ICD-10-CM

## 2016-09-07 DIAGNOSIS — Z3483 Encounter for supervision of other normal pregnancy, third trimester: Secondary | ICD-10-CM

## 2016-09-07 LAB — POCT URINALYSIS DIPSTICK
Blood, UA: NEGATIVE
Glucose, UA: NEGATIVE
KETONES UA: NEGATIVE
Nitrite, UA: NEGATIVE
PROTEIN UA: NEGATIVE

## 2016-09-07 NOTE — Progress Notes (Signed)
Low-risk OB appointment [id number] [redacted]w[redacted]d Estimated Date of Delivery: 10/15/16 BP 107/66   Pulse 69   Wt 147 lb 9.6 oz (67 kg)   LMP  (Exact Date)   BMI 28.83 kg/m   BP, weight, and urine reviewed.  Refer to obstetrical flow sheet for FH & FHR.  Reports good fm.  Denies regular uc's, lof, vb, or uti s/s. No complaints. Wants BTL, undecided in-pt vs. Interval salpingectomy for added benefit of reducing r/f ovarian cancer. Discussed risks/benefits, consent signed today.  Reviewed ptl s/s, fkc. Recommended Tdap at HD/PCP per CDC guidelines.  Plan:  Continue routine obstetrical care  F/U in 2wks for OB appointment and gbs  Review of chart after appt:  AFP was requested, but never drawn. Urine culture, gc/ct never done. Will send cx today- will order gc/ct at next visit w/ gbs. Rubella not drawn, was exactly 1.0 37yrs ago, will recheck at next appt.

## 2016-09-07 NOTE — Telephone Encounter (Signed)
Opened in error

## 2016-09-07 NOTE — Patient Instructions (Addendum)
Call the office [phone]) or go to [facility] if:  You begin to have strong, frequent contractions  Your water breaks.  Sometimes it is a big gush of fluid, sometimes it is just a trickle that keeps getting your panties wet or running down your legs  You have vaginal bleeding.  It is normal to have a small amount of spotting if your cervix was checked.   You don't feel your baby moving like normal.  If you don't, get you something to eat and drink and lay down and focus on feeling your baby move.  You should feel at least 10 movements in 2 hours.  If you don't, you should call the office or go to [facility].    Tdap Vaccine  It is recommended that you get the Tdap vaccine during the third trimester of EACH pregnancy to help protect your baby from getting pertussis (whooping cough)  27-36 weeks is the BEST time to do this so that you can pass the protection on to your baby. During pregnancy is better than after pregnancy, but if you are unable to get it during pregnancy it will be offered at the hospital.   You can get this vaccine at the health department or your family doctor  Everyone who will be around your baby should also be up-to-date on their vaccines. Adults (who are not pregnant) only need 1 dose of Tdap during adulthood.    Preterm Labor and Birth Information The normal length of a pregnancy is 39-41 weeks. Preterm labor is when labor starts before 37 completed weeks of pregnancy. What are the risk factors for preterm labor? Preterm labor is more likely to occur in women who:  Have certain infections during pregnancy such as a bladder infection, sexually transmitted infection, or infection inside the uterus (chorioamnionitis).  Have a shorter-than-normal cervix.  Have gone into preterm labor before.  Have had surgery on their cervix.  Are younger than age 39 or older than age 20.  Are African American.  Are pregnant with twins or multiple babies (multiple  gestation).  Take street drugs or smoke while pregnant.  Do not gain enough weight while pregnant.  Became pregnant shortly after having been pregnant. What are the symptoms of preterm labor? Symptoms of preterm labor include:  Cramps similar to those that can happen during a menstrual period. The cramps may happen with diarrhea.  Pain in the abdomen or lower back.  Regular uterine contractions that may feel like tightening of the abdomen.  A feeling of increased pressure in the pelvis.  Increased watery or bloody mucus discharge from the vagina.  Water breaking (ruptured amniotic sac). Why is it important to recognize signs of preterm labor? It is important to recognize signs of preterm labor because babies who are born prematurely may not be fully developed. This can put them at an increased risk for:  Long-term (chronic) heart and lung problems.  Difficulty immediately after birth with regulating body systems, including blood sugar, body temperature, heart rate, and breathing rate.  Bleeding in the brain.  Cerebral palsy.  Learning difficulties.  Death. These risks are highest for babies who are born before 82 weeks of pregnancy. How is preterm labor treated? Treatment depends on the length of your pregnancy, your condition, and the health of your baby. It may involve:  Having a stitch (suture) placed in your cervix to prevent your cervix from opening too early (cerclage).  Taking or being given medicines, such as:  Hormone  medicines. These may be given early in pregnancy to help support the pregnancy.  Medicine to stop contractions.  Medicines to help mature the baby's lungs. These may be prescribed if the risk of delivery is high.  Medicines to prevent your baby from developing cerebral palsy. If the labor happens before 34 weeks of pregnancy, you may need to stay in the hospital. What should I do if I think I am in preterm labor? If you think that you are  going into preterm labor, call your health care provider right away. How can I prevent preterm labor in future pregnancies? To increase your chance of having a full-term pregnancy:  Do not use any tobacco products, such as cigarettes, chewing tobacco, and e-cigarettes. If you need help quitting, ask your health care provider.  Do not use street drugs or medicines that have not been prescribed to you during your pregnancy.  Talk with your health care provider before taking any herbal supplements, even if you have been taking them regularly.  Make sure you gain a healthy amount of weight during your pregnancy.  Watch for infection. If you think that you might have an infection, get it checked right away.  Make sure to tell your health care provider if you have gone into preterm labor before. This information is not intended to replace advice given to you by your health care provider. Make sure you discuss any questions you have with your health care provider. Document Released: 11/10/2003 Document Revised: 01/31/2016 Document Reviewed: 01/11/2016 Elsevier Interactive Patient Education  2017 [employer].

## 2016-09-09 LAB — URINE CULTURE: ORGANISM ID, BACTERIA: NO GROWTH

## 2016-09-21 ENCOUNTER — Encounter: Payer: Self-pay | Admitting: Women's Health

## 2016-09-21 ENCOUNTER — Ambulatory Visit (INDEPENDENT_AMBULATORY_CARE_PROVIDER_SITE_OTHER): Payer: Medicaid Other | Admitting: Women's Health

## 2016-09-21 VITALS — BP 111/68 | HR 74 | Wt 151.0 lb

## 2016-09-21 DIAGNOSIS — Z1389 Encounter for screening for other disorder: Secondary | ICD-10-CM

## 2016-09-21 DIAGNOSIS — Z3483 Encounter for supervision of other normal pregnancy, third trimester: Secondary | ICD-10-CM

## 2016-09-21 DIAGNOSIS — Z3A36 36 weeks gestation of pregnancy: Secondary | ICD-10-CM

## 2016-09-21 DIAGNOSIS — Z331 Pregnant state, incidental: Secondary | ICD-10-CM

## 2016-09-21 DIAGNOSIS — Z363 Encounter for antenatal screening for malformations: Secondary | ICD-10-CM

## 2016-09-21 LAB — OB RESULTS CONSOLE GBS: GBS: NEGATIVE

## 2016-09-21 LAB — POCT URINALYSIS DIPSTICK
GLUCOSE UA: NEGATIVE
KETONES UA: NEGATIVE
NITRITE UA: NEGATIVE
Protein, UA: NEGATIVE
RBC UA: NEGATIVE

## 2016-09-21 NOTE — Progress Notes (Signed)
Low-risk OB appointment [id number] [redacted]w[redacted]d Estimated Date of Delivery: 10/15/16 BP 111/68   Pulse 74   Wt 151 lb (68.5 kg)   LMP  (Exact Date)   BMI 29.49 kg/m   BP, weight, and urine reviewed.  Refer to obstetrical flow sheet for FH & FHR.  Reports good fm.  Denies regular uc's, lof, vb, or uti s/s. No complaints. Decided for in-hospital btl, consent previously signed 09/07/16  GBS, gc/ct collected SVE: cl/th/-2, vtx Reviewed labor s/s, fkc. Discussed AFP never drawn, too late now.  Plan:  Continue routine obstetrical care  F/U in 1wk for OB appointment  Rubella today, not drawn at new ob, equivocal last pregnancy

## 2016-09-21 NOTE — Patient Instructions (Signed)
Call the office ([phone]) or go to [facility] if:  You begin to have strong, frequent contractions  Your water breaks.  Sometimes it is a big gush of fluid, sometimes it is just a trickle that keeps getting your panties wet or running down your legs  You have vaginal bleeding.  It is normal to have a small amount of spotting if your cervix was checked.   You don't feel your baby moving like normal.  If you don't, get you something to eat and drink and lay down and focus on feeling your baby move.  You should feel at least 10 movements in 2 hours.  If you don't, you should call the office or go to [facility].     Braxton Hicks Contractions Contractions of the uterus can occur throughout pregnancy. Contractions are not always a sign that you are in labor.  WHAT ARE BRAXTON HICKS CONTRACTIONS?  Contractions that occur before labor are called Braxton Hicks contractions, or false labor. Toward the end of pregnancy (32-34 weeks), these contractions can develop more often and may become more forceful. This is not true labor because these contractions do not result in opening (dilatation) and thinning of the cervix. They are sometimes difficult to tell apart from true labor because these contractions can be forceful and people have different pain tolerances. You should not feel embarrassed if you go to the hospital with false labor. Sometimes, the only way to tell if you are in true labor is for your health care provider to look for changes in the cervix. If there are no prenatal problems or other health problems associated with the pregnancy, it is completely safe to be sent home with false labor and await the onset of true labor. HOW CAN YOU TELL THE DIFFERENCE BETWEEN TRUE AND FALSE LABOR? False Labor   The contractions of false labor are usually shorter and not as hard as those of true labor.   The contractions are usually irregular.   The contractions are often felt in the front of  the lower abdomen and in the groin.   The contractions may go away when you walk around or change positions while lying down.   The contractions get weaker and are shorter lasting as time goes on.   The contractions do not usually become progressively stronger, regular, and closer together as with true labor.  True Labor   Contractions in true labor last 30-70 seconds, become very regular, usually become more intense, and increase in frequency.   The contractions do not go away with walking.   The discomfort is usually felt in the top of the uterus and spreads to the lower abdomen and low back.   True labor can be determined by your health care provider with an exam. This will show that the cervix is dilating and getting thinner.  WHAT TO REMEMBER  Keep up with your usual exercises and follow other instructions given by your health care provider.   Take medicines as directed by your health care provider.   Keep your regular prenatal appointments.   Eat and drink lightly if you think you are going into labor.   If Braxton Hicks contractions are making you uncomfortable:   Change your position from lying down or resting to walking, or from walking to resting.   Sit and rest in a tub of warm water.   Drink 2-3 glasses of water. Dehydration may cause these contractions.   Do slow and deep breathing several   times an hour.  WHEN SHOULD I SEEK IMMEDIATE MEDICAL CARE? Seek immediate medical care if:  Your contractions become stronger, more regular, and closer together.   You have fluid leaking or gushing from your vagina.   You have a fever.   You pass blood-tinged mucus.   You have vaginal bleeding.   You have continuous abdominal pain.   You have low back pain that you never had before.   You feel your baby's head pushing down and causing pelvic pressure.   Your baby is not moving as much as it used to.  This information is not intended to  replace advice given to you by your health care provider. Make sure you discuss any questions you have with your health care provider. Document Released: 08/20/2005 Document Revised: 12/12/2015 Document Reviewed: 06/01/2013 Elsevier Interactive Patient Education  2017 Elsevier Inc.  

## 2016-09-22 LAB — RUBELLA SCREEN: Rubella Antibodies, IGG: 1.1 index (ref >0.99)

## 2016-09-23 LAB — STREP GP B NAA: Strep Gp B NAA: NEGATIVE

## 2016-09-26 LAB — GC/CHLAMYDIA PROBE AMP
Chlamydia trachomatis, NAA: NEGATIVE
Neisseria gonorrhoeae by PCR: NEGATIVE

## 2016-09-28 ENCOUNTER — Ambulatory Visit (INDEPENDENT_AMBULATORY_CARE_PROVIDER_SITE_OTHER): Payer: Medicaid Other | Admitting: Women's Health

## 2016-09-28 ENCOUNTER — Ambulatory Visit (INDEPENDENT_AMBULATORY_CARE_PROVIDER_SITE_OTHER): Payer: Medicaid Other

## 2016-09-28 ENCOUNTER — Encounter: Payer: Self-pay | Admitting: Women's Health

## 2016-09-28 VITALS — BP 88/60 | HR 80 | Wt 152.0 lb

## 2016-09-28 DIAGNOSIS — Z3A38 38 weeks gestation of pregnancy: Secondary | ICD-10-CM | POA: Diagnosis not present

## 2016-09-28 DIAGNOSIS — O26843 Uterine size-date discrepancy, third trimester: Secondary | ICD-10-CM

## 2016-09-28 DIAGNOSIS — Z3483 Encounter for supervision of other normal pregnancy, third trimester: Secondary | ICD-10-CM | POA: Diagnosis not present

## 2016-09-28 DIAGNOSIS — O0932 Supervision of pregnancy with insufficient antenatal care, second trimester: Secondary | ICD-10-CM

## 2016-09-28 DIAGNOSIS — Z1389 Encounter for screening for other disorder: Secondary | ICD-10-CM

## 2016-09-28 DIAGNOSIS — Z3403 Encounter for supervision of normal first pregnancy, third trimester: Secondary | ICD-10-CM

## 2016-09-28 DIAGNOSIS — Z331 Pregnant state, incidental: Secondary | ICD-10-CM

## 2016-09-28 LAB — POCT URINALYSIS DIPSTICK
Blood, UA: NEGATIVE
Glucose, UA: NEGATIVE
KETONES UA: NEGATIVE
Nitrite, UA: NEGATIVE
PROTEIN UA: NEGATIVE

## 2016-09-28 NOTE — Progress Notes (Signed)
[place] [id number] wks,cephalic,fhr [id number] bpm,post pl gr 1, afi 13 cm,EFW 2837 g 25%

## 2016-09-28 NOTE — Patient Instructions (Signed)
Call the office ([phone]) or go to [facility] if:  You begin to have strong, frequent contractions  Your water breaks.  Sometimes it is a big gush of fluid, sometimes it is just a trickle that keeps getting your panties wet or running down your legs  You have vaginal bleeding.  It is normal to have a small amount of spotting if your cervix was checked.   You don't feel your baby moving like normal.  If you don't, get you something to eat and drink and lay down and focus on feeling your baby move.  You should feel at least 10 movements in 2 hours.  If you don't, you should call the office or go to [facility].     Braxton Hicks Contractions Contractions of the uterus can occur throughout pregnancy. Contractions are not always a sign that you are in labor.  WHAT ARE BRAXTON HICKS CONTRACTIONS?  Contractions that occur before labor are called Braxton Hicks contractions, or false labor. Toward the end of pregnancy (32-34 weeks), these contractions can develop more often and may become more forceful. This is not true labor because these contractions do not result in opening (dilatation) and thinning of the cervix. They are sometimes difficult to tell apart from true labor because these contractions can be forceful and people have different pain tolerances. You should not feel embarrassed if you go to the hospital with false labor. Sometimes, the only way to tell if you are in true labor is for your health care provider to look for changes in the cervix. If there are no prenatal problems or other health problems associated with the pregnancy, it is completely safe to be sent home with false labor and await the onset of true labor. HOW CAN YOU TELL THE DIFFERENCE BETWEEN TRUE AND FALSE LABOR? False Labor   The contractions of false labor are usually shorter and not as hard as those of true labor.   The contractions are usually irregular.   The contractions are often felt in the front of  the lower abdomen and in the groin.   The contractions may go away when you walk around or change positions while lying down.   The contractions get weaker and are shorter lasting as time goes on.   The contractions do not usually become progressively stronger, regular, and closer together as with true labor.  True Labor   Contractions in true labor last 30-70 seconds, become very regular, usually become more intense, and increase in frequency.   The contractions do not go away with walking.   The discomfort is usually felt in the top of the uterus and spreads to the lower abdomen and low back.   True labor can be determined by your health care provider with an exam. This will show that the cervix is dilating and getting thinner.  WHAT TO REMEMBER  Keep up with your usual exercises and follow other instructions given by your health care provider.   Take medicines as directed by your health care provider.   Keep your regular prenatal appointments.   Eat and drink lightly if you think you are going into labor.   If Braxton Hicks contractions are making you uncomfortable:   Change your position from lying down or resting to walking, or from walking to resting.   Sit and rest in a tub of warm water.   Drink 2-3 glasses of water. Dehydration may cause these contractions.   Do slow and deep breathing several   times an hour.  WHEN SHOULD I SEEK IMMEDIATE MEDICAL CARE? Seek immediate medical care if:  Your contractions become stronger, more regular, and closer together.   You have fluid leaking or gushing from your vagina.   You have a fever.   You pass blood-tinged mucus.   You have vaginal bleeding.   You have continuous abdominal pain.   You have low back pain that you never had before.   You feel your baby's head pushing down and causing pelvic pressure.   Your baby is not moving as much as it used to.  This information is not intended to  replace advice given to you by your health care provider. Make sure you discuss any questions you have with your health care provider. Document Released: 08/20/2005 Document Revised: 12/12/2015 Document Reviewed: 06/01/2013 Elsevier Interactive Patient Education  2017 Elsevier Inc.  

## 2016-09-28 NOTE — Progress Notes (Signed)
Low-risk OB appointment [id number] [redacted]w[redacted]d Estimated Date of Delivery: 10/15/16 BP (!) 88/60   Pulse 80   Wt 152 lb (68.9 kg)   LMP  (Exact Date)   BMI 29.69 kg/m   BP, weight, and urine reviewed.  Refer to obstetrical flow sheet for FH & FHR.  Reports good fm.  Denies regular uc's, lof, vb, or uti s/s. No complaints. SVE per request: 4/50/-2, vtx Reviewed gbs neg, labor s/s, fkc. Plan:  Continue routine obstetrical care  F/U toay @ 1pm for efw/afi u/s for s<d, then 1wk for OB appointment

## 2016-10-05 ENCOUNTER — Encounter: Payer: Medicaid Other | Admitting: Obstetrics & Gynecology

## 2016-10-06 ENCOUNTER — Inpatient Hospital Stay (HOSPITAL_COMMUNITY): Payer: Medicaid Other | Admitting: Anesthesiology

## 2016-10-06 ENCOUNTER — Encounter (HOSPITAL_COMMUNITY): Payer: Self-pay

## 2016-10-06 ENCOUNTER — Inpatient Hospital Stay (HOSPITAL_COMMUNITY)
Admission: AD | Admit: 2016-10-06 | Discharge: 2016-10-08 | DRG: 767 | Disposition: A | Discharge status: Home / Self Care | Payer: Medicaid Other | Source: Ambulatory Visit | Attending: Obstetrics & Gynecology | Admitting: Obstetrics & Gynecology

## 2016-10-06 DIAGNOSIS — O0932 Supervision of pregnancy with insufficient antenatal care, second trimester: Secondary | ICD-10-CM

## 2016-10-06 DIAGNOSIS — Z833 Family history of diabetes mellitus: Secondary | ICD-10-CM | POA: Diagnosis not present

## 2016-10-06 DIAGNOSIS — Z3493 Encounter for supervision of normal pregnancy, unspecified, third trimester: Secondary | ICD-10-CM | POA: Diagnosis present

## 2016-10-06 DIAGNOSIS — Z3A38 38 weeks gestation of pregnancy: Secondary | ICD-10-CM | POA: Diagnosis not present

## 2016-10-06 DIAGNOSIS — Z302 Encounter for sterilization: Secondary | ICD-10-CM | POA: Diagnosis not present

## 2016-10-06 DIAGNOSIS — Z3403 Encounter for supervision of normal first pregnancy, third trimester: Secondary | ICD-10-CM

## 2016-10-06 LAB — CBC
HEMATOCRIT: 33 % — AB (ref 36.0–46.0)
Hemoglobin: 10.6 g/dL — ABNORMAL LOW (ref 12.0–15.0)
MCH: 23 pg — ABNORMAL LOW (ref 26.0–34.0)
MCHC: 32.1 g/dL (ref 30.0–36.0)
MCV: 71.6 fL — ABNORMAL LOW (ref 78.0–100.0)
Platelets: 216 10*3/uL (ref 150–400)
RBC: 4.61 MIL/uL (ref 3.87–5.11)
RDW: 14.6 % (ref 11.5–15.5)
WBC: 12 10*3/uL — AB (ref 4.0–10.5)

## 2016-10-06 LAB — TYPE AND SCREEN
ABO/RH(D): O POS
ANTIBODY SCREEN: NEGATIVE

## 2016-10-06 MED ORDER — METOCLOPRAMIDE HCL 10 MG PO TABS
10.0000 mg | ORAL_TABLET | Freq: Once | ORAL | Status: AC
  Administered 2016-10-07: 10 mg via ORAL
  Filled 2016-10-06: qty 1

## 2016-10-06 MED ORDER — COCONUT OIL OIL: 1.0000 "application " | TOPICAL_OIL | Status: DC | PRN

## 2016-10-06 MED ORDER — BENZOCAINE-MENTHOL 20-0.5 % EX AERO
1.0000 "application " | INHALATION_SPRAY | CUTANEOUS | Status: DC | PRN
  Administered 2016-10-06: 1 via TOPICAL
  Filled 2016-10-06: qty 56

## 2016-10-06 MED ORDER — ZOLPIDEM TARTRATE 5 MG PO TABS: 5.0000 mg | ORAL_TABLET | Freq: Every evening | ORAL | Status: DC | PRN

## 2016-10-06 MED ORDER — LACTATED RINGERS IV SOLN
INTRAVENOUS | Status: DC
  Administered 2016-10-07: 10:00:00 via INTRAVENOUS

## 2016-10-06 MED ORDER — OXYCODONE-ACETAMINOPHEN 5-325 MG PO TABS: 2.0000 | ORAL_TABLET | ORAL | Status: DC | PRN

## 2016-10-06 MED ORDER — PHENYLEPHRINE 40 MCG/ML (10ML) SYRINGE FOR IV PUSH (FOR BLOOD PRESSURE SUPPORT)
80.0000 ug | PREFILLED_SYRINGE | INTRAVENOUS | Status: DC | PRN
  Filled 2016-10-06: qty 5
  Filled 2016-10-06: qty 10

## 2016-10-06 MED ORDER — FAMOTIDINE 20 MG PO TABS
40.0000 mg | ORAL_TABLET | Freq: Once | ORAL | Status: AC
  Administered 2016-10-07: 40 mg via ORAL
  Filled 2016-10-06: qty 2

## 2016-10-06 MED ORDER — DIPHENHYDRAMINE HCL 50 MG/ML IJ SOLN: 12.5000 mg | INTRAMUSCULAR | Status: DC | PRN

## 2016-10-06 MED ORDER — OXYCODONE-ACETAMINOPHEN 5-325 MG PO TABS: 1.0000 | ORAL_TABLET | ORAL | Status: DC | PRN

## 2016-10-06 MED ORDER — SENNOSIDES-DOCUSATE SODIUM 8.6-50 MG PO TABS
2.0000 | ORAL_TABLET | ORAL | Status: DC
  Administered 2016-10-06: 2 via ORAL
  Filled 2016-10-06: qty 2

## 2016-10-06 MED ORDER — LIDOCAINE HCL (PF) 1 % IJ SOLN
INTRAMUSCULAR | Status: DC | PRN
  Administered 2016-10-06: 2 mL via EPIDURAL
  Administered 2016-10-06: 5 mL via EPIDURAL
  Administered 2016-10-06: 3 mL via EPIDURAL

## 2016-10-06 MED ORDER — ACETAMINOPHEN 325 MG PO TABS
650.0000 mg | ORAL_TABLET | ORAL | Status: DC | PRN
  Administered 2016-10-07: 650 mg via ORAL
  Filled 2016-10-06: qty 2

## 2016-10-06 MED ORDER — LACTATED RINGERS IV SOLN: 500.0000 mL | INTRAVENOUS | Status: DC | PRN

## 2016-10-06 MED ORDER — EPHEDRINE 5 MG/ML INJ
10.0000 mg | INTRAVENOUS | Status: DC | PRN
  Filled 2016-10-06: qty 4

## 2016-10-06 MED ORDER — IBUPROFEN 600 MG PO TABS
600.0000 mg | ORAL_TABLET | Freq: Four times a day (QID) | ORAL | Status: DC
  Administered 2016-10-06 – 2016-10-08 (×6): 600 mg via ORAL
  Filled 2016-10-06 (×7): qty 1

## 2016-10-06 MED ORDER — OXYTOCIN 40 UNITS IN LACTATED RINGERS INFUSION - SIMPLE MED
2.5000 [IU]/h | INTRAVENOUS | Status: DC
  Filled 2016-10-06: qty 1000

## 2016-10-06 MED ORDER — LIDOCAINE HCL (PF) 1 % IJ SOLN
30.0000 mL | INTRAMUSCULAR | Status: DC | PRN
  Filled 2016-10-06: qty 30

## 2016-10-06 MED ORDER — ONDANSETRON HCL 4 MG PO TABS: 4.0000 mg | ORAL_TABLET | ORAL | Status: DC | PRN

## 2016-10-06 MED ORDER — SOD CITRATE-CITRIC ACID 500-334 MG/5ML PO SOLN: 30.0000 mL | ORAL | Status: DC | PRN

## 2016-10-06 MED ORDER — FENTANYL 2.5 MCG/ML BUPIVACAINE 1/10 % EPIDURAL INFUSION (WH - ANES)
14.0000 mL/h | INTRAMUSCULAR | Status: DC | PRN
  Administered 2016-10-06: 14 mL/h via EPIDURAL
  Filled 2016-10-06: qty 100

## 2016-10-06 MED ORDER — OXYTOCIN BOLUS FROM INFUSION
500.0000 mL | Freq: Once | INTRAVENOUS | Status: AC
  Administered 2016-10-06: 500 mL via INTRAVENOUS

## 2016-10-06 MED ORDER — LACTATED RINGERS IV SOLN: INTRAVENOUS | Status: DC

## 2016-10-06 MED ORDER — ONDANSETRON HCL 4 MG/2ML IJ SOLN: 4.0000 mg | Freq: Four times a day (QID) | INTRAMUSCULAR | Status: DC | PRN

## 2016-10-06 MED ORDER — FENTANYL CITRATE (PF) 100 MCG/2ML IJ SOLN: 100.0000 ug | INTRAMUSCULAR | Status: DC | PRN

## 2016-10-06 MED ORDER — LACTATED RINGERS IV SOLN: 500.0000 mL | Freq: Once | INTRAVENOUS | Status: DC

## 2016-10-06 MED ORDER — PHENYLEPHRINE 40 MCG/ML (10ML) SYRINGE FOR IV PUSH (FOR BLOOD PRESSURE SUPPORT)
80.0000 ug | PREFILLED_SYRINGE | INTRAVENOUS | Status: DC | PRN
  Filled 2016-10-06: qty 5

## 2016-10-06 MED ORDER — ONDANSETRON HCL 4 MG/2ML IJ SOLN: 4.0000 mg | INTRAMUSCULAR | Status: DC | PRN

## 2016-10-06 MED ORDER — ACETAMINOPHEN 325 MG PO TABS: 650.0000 mg | ORAL_TABLET | ORAL | Status: DC | PRN

## 2016-10-06 MED ORDER — SIMETHICONE 80 MG PO CHEW
80.0000 mg | CHEWABLE_TABLET | ORAL | Status: DC | PRN
  Administered 2016-10-07: 80 mg via ORAL
  Filled 2016-10-06: qty 1

## 2016-10-06 MED ORDER — DIBUCAINE 1 % RE OINT: 1.0000 "application " | TOPICAL_OINTMENT | RECTAL | Status: DC | PRN

## 2016-10-06 MED ORDER — DIPHENHYDRAMINE HCL 25 MG PO CAPS: 25.0000 mg | ORAL_CAPSULE | Freq: Four times a day (QID) | ORAL | Status: DC | PRN

## 2016-10-06 MED ORDER — WITCH HAZEL-GLYCERIN EX PADS: 1.0000 "application " | MEDICATED_PAD | CUTANEOUS | Status: DC | PRN

## 2016-10-06 MED ORDER — PRENATAL MULTIVITAMIN CH
1.0000 | ORAL_TABLET | Freq: Every day | ORAL | Status: DC
  Administered 2016-10-07: 1 via ORAL
  Filled 2016-10-06: qty 1

## 2016-10-06 MED ORDER — TETANUS-DIPHTH-ACELL PERTUSSIS 5-2.5-18.5 LF-MCG/0.5 IM SUSP
0.5000 mL | Freq: Once | INTRAMUSCULAR | Status: AC
  Administered 2016-10-08: 0.5 mL via INTRAMUSCULAR
  Filled 2016-10-06: qty 0.5

## 2016-10-06 NOTE — Anesthesia Procedure Notes (Signed)
Epidural Patient location during procedure: OB Start time: 10/06/2016 1:35 PM End time: 10/06/2016 1:40 PM  Staffing Anesthesiologist: [name] Performed: anesthesiologist   Preanesthetic Checklist Completed: patient identified, pre-op evaluation, timeout performed, IV checked, risks and benefits discussed and monitors and equipment checked  Epidural Patient position: sitting Prep: DuraPrep Patient monitoring: blood pressure and continuous pulse ox Approach: midline Location: L3-L4 Injection technique: LOR air  Needle:  Needle type: Tuohy  Needle gauge: 17 G Needle length: 9 cm Needle insertion depth: 5 cm Catheter size: 19 Gauge Catheter at skin depth: 10 cm Test dose: negative and Other (1% Lidocaine)  Additional Notes Patient identified.  Risk benefits discussed including failed block, incomplete pain control, headache, nerve damage, paralysis, blood pressure changes, nausea, vomiting, reactions to medication both toxic or allergic, and postpartum back pain.  Patient expressed understanding and wished to proceed.  All questions were answered.  Sterile technique used throughout procedure and epidural site dressed with sterile barrier dressing. No paresthesia or other complications noted. The patient did not experience any signs of intravascular injection such as tinnitus or metallic taste in mouth nor signs of intrathecal spread such as rapid motor block. Please see nursing notes for vital signs. Reason for block:procedure for pain

## 2016-10-06 NOTE — Anesthesia Preprocedure Evaluation (Signed)
Anesthesia Evaluation  Patient identified by MRN, date of birth, ID band Patient awake    Reviewed: Allergy & Precautions, NPO status , Patient's Chart, lab work & pertinent test results  Airway Mallampati: II  TM Distance: >3 FB Neck ROM: Full    Dental  (+) Teeth Intact, Dental Advisory Given   Pulmonary neg pulmonary ROS,    Pulmonary exam normal breath sounds clear to auscultation       Cardiovascular Exercise Tolerance: Good negative cardio ROS Normal cardiovascular exam Rhythm:Regular Rate:Normal     Neuro/Psych negative neurological ROS     GI/Hepatic negative GI ROS, Neg liver ROS,   Endo/Other  negative endocrine ROS  Renal/GU negative Renal ROS     Musculoskeletal negative musculoskeletal ROS (+)   Abdominal   Peds  Hematology  (+) Blood dyscrasia, anemia , Plt 216k   Anesthesia Other Findings Day of surgery medications reviewed with the patient.  Reproductive/Obstetrics (+) Pregnancy                             Anesthesia Physical Anesthesia Plan  ASA: II  Anesthesia Plan: Epidural   Post-op Pain Management:    Induction:   Airway Management Planned:   Additional Equipment:   Intra-op Plan:   Post-operative Plan:   Informed Consent: I have reviewed the patients History and Physical, chart, labs and discussed the procedure including the risks, benefits and alternatives for the proposed anesthesia with the patient or authorized representative who has indicated his/her understanding and acceptance.   Dental advisory given  Plan Discussed with:   Anesthesia Plan Comments: (Patient identified. Risks/Benefits/Options discussed with patient including but not limited to bleeding, infection, nerve damage, paralysis, failed block, incomplete pain control, headache, blood pressure changes, nausea, vomiting, reactions to medication both or allergic, itching and postpartum  back pain. Confirmed with bedside nurse the patient's most recent platelet count. Confirmed with patient that they are not currently taking any anticoagulation, have any bleeding history or any family history of bleeding disorders. Patient expressed understanding and wished to proceed. All questions were answered. )        Anesthesia Quick Evaluation

## 2016-10-06 NOTE — H&P (Signed)
LABOR AND DELIVERY ADMISSION HISTORY AND PHYSICAL NOTE  Faith Allen is a 31 y.o. female [phone] with IUP at [redacted]w[redacted]d by LMP presenting for labor. Patient reported contractions that started around 0530/0600 this morning and were q4min since 1100.  Denies CP, SOB, NVD, HA/changes in vision or LE edema.  She reports positive fetal movement. She denies leakage of fluid or vaginal bleeding.  Prenatal History/Complications:  Past Medical History: Past Medical History:  Diagnosis Date  . Medical history non-contributory     Past Surgical History: Past Surgical History:  Procedure Laterality Date  . NO PAST SURGERIES      Obstetrical History: OB History    Gravida Para Term Preterm AB Living   4 3 3     3    SAB TAB Ectopic Multiple Live Births         0 3      Social History: Social History   Social History  . Marital status: Married    Spouse name: N/A  . Number of children: 3  . Years of education: N/A   Occupational History  . PT-Daycare    Social History Main Topics  . Smoking status: Never Smoker  . Smokeless tobacco: Never Used  . Alcohol use No     Comment: rare  . Drug use: No  . Sexual activity: Not Currently    Birth control/ protection: None   Other Topics Concern  . None   Social History Narrative  . None    Family History: Family History  Problem Relation Age of Onset  . Diabetes Mother   . Miscarriages / Stillbirths Sister   . Irritable bowel syndrome Sister   . Inflammatory bowel disease Neg Hx   . Colon cancer Neg Hx     Allergies: No Known Allergies  No prescriptions prior to admission.     Review of Systems   All systems reviewed and negative except as stated in HPI  Height 5' (1.524 m), weight 68.9 kg (152 lb), currently breastfeeding. General appearance: alert and cooperative Lungs: clear to auscultation bilaterally Heart: regular rate and rhythm Abdomen: soft, non-tender; bowel sounds normal Extremities: No calf swelling or  tenderness Presentation: cephalic Fetal monitoring:  basline HR 120s, mod variability, pos accels, no decels Uterine activity:  Dilation: 5 Effacement (%): 90 Station: -2 Exam by:: Valda Favia RN   Prenatal labs: ABO, Rh:  O POS Antibody: Negative (12/08 0916) Rubella: Immune RPR: Non Reactive (12/08 0916)  HBsAg: Negative (09/14 1518)  HIV: Non Reactive (12/08 0916)  GBS: Negative (01/19 1345)  1 hr Glucola: 2hr 82/98/91 Genetic screening:  AFP never drawn Anatomy US: Normal girl [facility])  Prenatal Transfer Tool  Maternal Diabetes: No Genetic Screening: AFP never drawn Maternal Ultrasounds/Referrals: Normal Fetal Ultrasounds or other Referrals:  None Maternal Substance Abuse:  No Significant Maternal Medications:  None Significant Maternal Lab Results: None  Results for orders placed or performed during the hospital encounter of 10/06/16 (from the past 24 hour(s))  CBC   Collection Time: 10/06/16 12:50 PM  Result Value Ref Range   WBC 12.0 (H) 4.0 - 10.5 K/uL   RBC 4.61 3.87 - 5.11 MIL/uL   Hemoglobin 10.6 (L) 12.0 - 15.0 g/dL   HCT 33.0 (L) 36.0 - 46.0 %   MCV 71.6 (L) 78.0 - 100.0 fL   MCH 23.0 (L) 26.0 - 34.0 pg   MCHC 32.1 30.0 - 36.0 g/dL   RDW 14.6 11.5 - 15.5 %   Platelets 216  150 - 400 K/uL    Patient Active Problem List   Diagnosis Date Noted  . Normal labor 10/06/2016  . Insufficient prenatal care in second trimester 08/07/2016  . Supervision of normal pregnancy 05/17/2016  . Constipation 12/31/2014  . Rectal bleeding 12/31/2014  . Susceptible to varicella (non-immune), currently pregnant 03/06/2014  . Uterine fibroid in pregnancy 03/03/2014    Assessment: Faith Allen is a 31 y.o. G4P3003 at [redacted]w[redacted]d here for SOL currently in active labor. Membranes intact. Reporting contractions q67min.   #Labor: SOL, anticipate SVD #Pain: epidural #FWB:  Cat 1 #ID: GBS neg #MOF: breast #MOC: BTL   Eloise Levels, MD PGY-1 10/06/2016, 1:32 PM    OB  FELLOW HISTORY AND PHYSICAL ATTESTATION  I have seen and examined this patient; I agree with above documentation in the resident's note.    Faith Allen 10/06/2016, 7:19 PM

## 2016-10-07 ENCOUNTER — Inpatient Hospital Stay (HOSPITAL_COMMUNITY): Payer: Medicaid Other | Admitting: Certified Registered Nurse Anesthetist

## 2016-10-07 ENCOUNTER — Encounter (HOSPITAL_COMMUNITY)
Admission: AD | Disposition: A | Discharge status: Home / Self Care | Payer: Self-pay | Source: Ambulatory Visit | Attending: Obstetrics & Gynecology

## 2016-10-07 ENCOUNTER — Encounter (HOSPITAL_COMMUNITY): Payer: Self-pay | Admitting: Certified Registered Nurse Anesthetist

## 2016-10-07 DIAGNOSIS — Z302 Encounter for sterilization: Secondary | ICD-10-CM

## 2016-10-07 HISTORY — PX: TUBAL LIGATION: SHX77

## 2016-10-07 LAB — RPR: RPR Ser Ql: NONREACTIVE

## 2016-10-07 SURGERY — LIGATION, FALLOPIAN TUBE, POSTPARTUM
Anesthesia: Epidural | Site: Abdomen | Laterality: Bilateral

## 2016-10-07 MED ORDER — KETOROLAC TROMETHAMINE 30 MG/ML IJ SOLN
INTRAMUSCULAR | Status: AC
  Filled 2016-10-07: qty 1

## 2016-10-07 MED ORDER — MIDAZOLAM HCL 2 MG/2ML IJ SOLN
INTRAMUSCULAR | Status: AC
  Filled 2016-10-07: qty 2

## 2016-10-07 MED ORDER — OXYCODONE HCL 5 MG PO TABS
10.0000 mg | ORAL_TABLET | ORAL | Status: DC | PRN
  Administered 2016-10-07: 10 mg via ORAL
  Filled 2016-10-07 (×2): qty 2

## 2016-10-07 MED ORDER — FENTANYL CITRATE (PF) 100 MCG/2ML IJ SOLN
INTRAMUSCULAR | Status: DC | PRN
  Administered 2016-10-07: 100 ug via EPIDURAL

## 2016-10-07 MED ORDER — LIDOCAINE-EPINEPHRINE (PF) 2 %-1:200000 IJ SOLN
INTRAMUSCULAR | Status: AC
  Filled 2016-10-07: qty 20

## 2016-10-07 MED ORDER — FENTANYL CITRATE (PF) 100 MCG/2ML IJ SOLN: 25.0000 ug | INTRAMUSCULAR | Status: DC | PRN

## 2016-10-07 MED ORDER — ONDANSETRON HCL 4 MG/2ML IJ SOLN
INTRAMUSCULAR | Status: DC | PRN
Start: 2016-10-07 — End: 2016-10-07
  Administered 2016-10-07: 4 mg via INTRAVENOUS

## 2016-10-07 MED ORDER — KETOROLAC TROMETHAMINE 30 MG/ML IJ SOLN
INTRAMUSCULAR | Status: DC | PRN
  Administered 2016-10-07: 30 mg via INTRAVENOUS

## 2016-10-07 MED ORDER — PROMETHAZINE HCL 25 MG/ML IJ SOLN: 6.2500 mg | INTRAMUSCULAR | Status: DC | PRN

## 2016-10-07 MED ORDER — MIDAZOLAM HCL 2 MG/2ML IJ SOLN
INTRAMUSCULAR | Status: DC | PRN
  Administered 2016-10-07 (×2): 1 mg via INTRAVENOUS

## 2016-10-07 MED ORDER — FENTANYL CITRATE (PF) 100 MCG/2ML IJ SOLN
INTRAMUSCULAR | Status: AC
  Filled 2016-10-07: qty 2

## 2016-10-07 MED ORDER — OXYCODONE HCL 5 MG PO TABS: 5.0000 mg | ORAL_TABLET | ORAL | Status: DC | PRN

## 2016-10-07 MED ORDER — ONDANSETRON HCL 4 MG/2ML IJ SOLN
INTRAMUSCULAR | Status: AC
  Filled 2016-10-07: qty 2

## 2016-10-07 MED ORDER — SODIUM BICARBONATE 8.4 % IV SOLN
INTRAVENOUS | Status: DC | PRN
  Administered 2016-10-07: 5 mL via EPIDURAL
  Administered 2016-10-07 (×2): 3 mL via EPIDURAL
  Administered 2016-10-07: 2 mL via EPIDURAL

## 2016-10-07 SURGICAL SUPPLY — 32 items
ADH SKN CLS APL DERMABOND .7 (GAUZE/BANDAGES/DRESSINGS) ×1
ADH SKN CLS LQ APL DERMABOND (GAUZE/BANDAGES/DRESSINGS) ×1
BLADE SURG 11 STRL SS (BLADE) ×3 IMPLANT
CLOTH BEACON ORANGE TIMEOUT ST (SAFETY) ×3 IMPLANT
CONTAINER PREFILL 10% NBF 15ML (MISCELLANEOUS) ×3 IMPLANT
DERMABOND ADHESIVE PROPEN (GAUZE/BANDAGES/DRESSINGS) ×2
DERMABOND ADVANCED (GAUZE/BANDAGES/DRESSINGS) ×2
DERMABOND ADVANCED .7 DNX12 (GAUZE/BANDAGES/DRESSINGS) ×1 IMPLANT
DERMABOND ADVANCED .7 DNX6 (GAUZE/BANDAGES/DRESSINGS) IMPLANT
DRSG OPSITE POSTOP 3X4 (GAUZE/BANDAGES/DRESSINGS) ×3 IMPLANT
ELECT REM PT RETURN 9FT ADLT (ELECTROSURGICAL)
ELECTRODE REM PT RTRN 9FT ADLT (ELECTROSURGICAL) IMPLANT
GLOVE BIOGEL PI IND STRL 7.0 (GLOVE) ×1 IMPLANT
GLOVE BIOGEL PI IND STRL 8 (GLOVE) ×1 IMPLANT
GLOVE BIOGEL PI INDICATOR 7.0 (GLOVE) ×2
GLOVE BIOGEL PI INDICATOR 8 (GLOVE) ×2
GLOVE ECLIPSE 8.0 STRL XLNG CF (GLOVE) ×3 IMPLANT
GOWN STRL REUS W/TWL LRG LVL3 (GOWN DISPOSABLE) ×6 IMPLANT
NEEDLE HYPO 22GX1.5 SAFETY (NEEDLE) IMPLANT
NS IRRIG 1000ML POUR BTL (IV SOLUTION) ×3 IMPLANT
PACK ABDOMINAL MINOR (CUSTOM PROCEDURE TRAY) ×3 IMPLANT
PENCIL BUTTON HOLSTER BLD 10FT (ELECTRODE) IMPLANT
PROTECTOR NERVE ULNAR (MISCELLANEOUS) ×3 IMPLANT
SPONGE LAP 4X18 X RAY DECT (DISPOSABLE) IMPLANT
SUT PLAIN 2 0 (SUTURE) ×6
SUT PLAIN ABS 2-0 CT1 27XMFL (SUTURE) ×2 IMPLANT
SUT VIC AB 0 CT1 27 (SUTURE) ×3
SUT VIC AB 0 CT1 27XBRD ANBCTR (SUTURE) ×1 IMPLANT
SUT VIC AB 4-0 KS 27 (SUTURE) ×3 IMPLANT
SYR CONTROL 10ML LL (SYRINGE) IMPLANT
TOWEL OR 17X24 6PK STRL BLUE (TOWEL DISPOSABLE) ×6 IMPLANT
TRAY FOLEY CATH SILVER 14FR (SET/KITS/TRAYS/PACK) ×3 IMPLANT

## 2016-10-07 NOTE — Anesthesia Postprocedure Evaluation (Signed)
Anesthesia Post Note  Patient: Faith Allen  Procedure(s) Performed: * No procedures listed *  Patient location during evaluation: Mother Baby Anesthesia Type: Epidural Level of consciousness: awake Pain management: pain level controlled Vital Signs Assessment: post-procedure vital signs reviewed and stable Respiratory status: spontaneous breathing Cardiovascular status: stable Postop Assessment: no headache, no backache, epidural receding and patient able to bend at knees Anesthetic complications: no        Last Vitals:  Vitals:   10/07/16 1145 10/07/16 1213  BP: 103/63 114/67  Pulse: (!) 58 63  Resp: 13 14  Temp:  36.6 C    Last Pain:  Vitals:   10/07/16 1213  TempSrc:   PainSc: 1    Pain Goal: Patients Stated Pain Goal: 4 (10/06/16 1242)               Everette Rank

## 2016-10-07 NOTE — Addendum Note (Signed)
Addendum  created 10/07/16 1221 by Georgeanne Nim, CRNA   Charge Capture section accepted, Sign clinical note

## 2016-10-07 NOTE — Transfer of Care (Signed)
Immediate Anesthesia Transfer of Care Note  Patient: Faith Allen  Procedure(s) Performed: Procedure(s): POST PARTUM TUBAL LIGATION (Bilateral)  Patient Location: PACU  Anesthesia Type:Epidural  Level of Consciousness: awake, alert , oriented and patient cooperative  Airway & Oxygen Therapy: Patient Spontanous Breathing  Post-op Assessment: Report given to RN and Post -op Vital signs reviewed and stable  Post vital signs: Reviewed and stable  Last Vitals:  Vitals:   10/07/16 0700 10/07/16 0836  BP: 106/67 (!) 98/54  Pulse: 67 62  Resp: 18 18  Temp: 36.8 C 36.8 C    Last Pain:  Vitals:   10/07/16 0836  TempSrc: Oral  PainSc: 0-No pain      Patients Stated Pain Goal: 4 ([id number])  Complications: No apparent anesthesia complications

## 2016-10-07 NOTE — Anesthesia Postprocedure Evaluation (Signed)
Anesthesia Post Note  Patient: Faith Allen  Procedure(s) Performed: * No procedures listed *  Patient location during evaluation: Mother Baby Anesthesia Type: Epidural Level of consciousness: awake and alert Pain management: pain level controlled Vital Signs Assessment: post-procedure vital signs reviewed and stable Respiratory status: spontaneous breathing, nonlabored ventilation and respiratory function stable Cardiovascular status: stable Postop Assessment: no headache, no backache and epidural receding Anesthetic complications: no Comments: Epidural remains in place due to upcoming BTL.        Last Vitals:  Vitals:   10/07/16 0700 10/07/16 0836  BP: 106/67 (!) 98/54  Pulse: 67 62  Resp: 18 18  Temp: 36.8 C 36.8 C    Last Pain:  Vitals:   10/07/16 0836  TempSrc: Oral  PainSc: 0-No pain   Pain Goal: Patients Stated Pain Goal: 4 (10/06/16 1242)               [name]

## 2016-10-07 NOTE — Op Note (Signed)
  Preoperative diagnosis:  Multiparous female who desires permanent sterilization  Postoperative diagnosis:  Same as above  Procedure:  Postpartum partum bilateral tubal ligation using modified Pomeroy technique  Surgeon:  Florian Buff  Asst.:    Anesthesia:  epidural  Findings:  Patient had a normal postpartum uterus tubes and ovaries.  Description of operation:  Patient was taken to the operating room where she had her epidural dosed.  She was then placed in the supine position.  She was then prepped and draped in the usual sterile fashion and a Foley catheter was placed in the bladder after the spinal was dosed.  A semilunar incision was made just below the umbilicus and taken down sharply to the fascia which was incised.  The peritoneum was then entered manually.  The right fallopian tube was identified including the fimbriated end.  The right fallopian tube was grasped and a 2-1/2 cm segment was removed using the modified Pomeroy technique.  There was good hemostasis.  Attention was then turned to the left fallopian tube which was identified including the fimbriated end.  A 2-1/2 cm segment in the distal isthmic and ampullary region portion of the tube was removed again using a modified Pomeroy technique.  There was good hemostasis.    The subcutaneous tissue fascia and peritoneum were closed using 0 vicryl in a running fashion.  The subcutaneous tissue was reapproximated with interrupted 3-0 Monocryl sutures.  The skin was closed using 4-0 Vicryl on a Keith needle in a subcuticular fashion.  Liquiban was placed for additional wound integrity and also to serve as a bandage.  The patient was taken to the recovery room in good stable condition.  All counts were correct.  Blood loss was minimal.    Florian Buff, MD 10/07/2016 10:48 AM

## 2016-10-07 NOTE — Lactation Note (Signed)
This note was copied from a baby's chart. Lactation Consultation Note  Patient Name: Faith Allen [id number] Date: 10/07/2016 Reason for consult: Follow-up assessment;Infant < 6lbs   Follow up with mom of 48 hour old infant. Infant was latched and feeding. She was noted to have a lot of sleepy spells at breast, mom did well with stimulating infant to feed. Infant has not stooled since birth.   DEBP set up with instructions for assembling, disassembling, cleaning of parts and using on Initiate phase. Enc mom to feed infant 8-12 x in 24 hours at first feeding cues with no longer than 3 hours between feeds. Enc mom to pump on Initiate setting for 15 minutes with DEBP post BF. Enc mom to supplement infant with any EBM available via spoon. Mom hand expressed 1 cc colostrum and mom was shown how to spoon feed infant. Discussed that normal feeding amounts for day 1 is between 5-12 ml/feeding. Mom voiced understanding to plan.  Enc mom to call out for feeding assistance as needed. Mom independent with feeding infant.    Maternal Data Formula Feeding for Exclusion: No Has patient been taught Hand Expression?: Yes Does the patient have breastfeeding experience prior to this delivery?: Yes  Feeding Feeding Type: Breast Fed Length of feed: 25 min  LATCH Score/Interventions Latch: Repeated attempts needed to sustain latch, nipple held in mouth throughout feeding, stimulation needed to elicit sucking reflex. Intervention(s): Adjust position;Assist with latch;Breast massage;Breast compression  Audible Swallowing: A few with stimulation Intervention(s): Hand expression;Alternate breast massage;Skin to skin  Type of Nipple: Everted at rest and after stimulation  Comfort (Breast/Nipple): Soft / non-tender     Hold (Positioning): No assistance needed to correctly position infant at breast. Intervention(s): Breastfeeding basics reviewed;Support Pillows;Position options;Skin to skin  LATCH Score:  8  Lactation Tools Discussed/Used WIC Program: No Pump Review: Setup, frequency, and cleaning;Milk Storage Initiated by:: Nonah Mattes, RN, IBCLC Date initiated:: 10/07/16   Consult Status Consult Status: Follow-up Date: 10/08/16 Follow-up type: In-patient    Debby Freiberg Hice 10/07/2016, 5:34 PM

## 2016-10-07 NOTE — Lactation Note (Signed)
This note was copied from a baby's chart. Lactation Consultation Note  Patient Name: Faith Allen [id number] Date: 10/07/2016 Reason for consult: Initial assessment;Infant < 6lbs   Initial consult with Exp BF mom of 27 hour old infant. Infant asleep in dad's arms. Mom in pain after surgery, enc her to take pain meds as needed.   Mom reports she BF her older children for 2 months to 1.5 years.   Infant with 9 BF for 10-30 minutes, 2 voids and 0 stools since birth. LATCH scores 9-10.   Mom reports she is BF well although she is concerned infant is not getting enough. Showed mom how to hand express and and colostrum easily compressible. Mom with firm compressible breasts and areola with everted nipples.    Introduced LPT infant handout due to infant weight and discussed importance of feeding infant at least every 3 hours or more often if she is cueing, keep her hat on at all times and keep infant STS as much as possible.   Discussed with mom that since infant < 6 pounds that offering EBM may be a good idea. Discussed setting up pump for mom to pump to stimulate milk productions and to offer supplement to infant. Mom agreeable. [name], RN to set up pump later today.   [name] and BF Resources Handout given, mom informed of IP/OP Services, BF Support Groups and [place] phone #. Enc mom to call out for assistance as needed.   Mom has a Medela PIS at home for use.    Maternal Data Formula Feeding for Exclusion: No Has patient been taught Hand Expression?: Yes Does the patient have breastfeeding experience prior to this delivery?: Yes  Feeding Feeding Type: Breast Fed Length of feed: 15 min  LATCH Score/Interventions Latch: Grasps breast easily, tongue down, lips flanged, rhythmical sucking.  Audible Swallowing: A few with stimulation  Type of Nipple: Everted at rest and after stimulation  Comfort (Breast/Nipple): Soft / non-tender     Hold (Positioning): No assistance needed to  correctly position infant at breast.  LATCH Score: 9  Lactation Tools Discussed/Used WIC Program: No   Consult Status Consult Status: Follow-up Date: 10/08/16 Follow-up type: In-patient    Debby Freiberg Trista Ciocca 10/07/2016, 2:50 PM

## 2016-10-07 NOTE — Anesthesia Preprocedure Evaluation (Signed)
Anesthesia Evaluation  Patient identified by MRN, date of birth, ID band Patient awake    Reviewed: Allergy & Precautions, NPO status , Patient's Chart, lab work & pertinent test results  Airway Mallampati: II  TM Distance: >3 FB Neck ROM: Full    Dental  (+) Teeth Intact, Dental Advisory Given   Pulmonary neg pulmonary ROS,    Pulmonary exam normal breath sounds clear to auscultation       Cardiovascular Exercise Tolerance: Good negative cardio ROS Normal cardiovascular exam Rhythm:Regular Rate:Normal     Neuro/Psych negative neurological ROS     GI/Hepatic negative GI ROS, Neg liver ROS,   Endo/Other  negative endocrine ROS  Renal/GU negative Renal ROS     Musculoskeletal negative musculoskeletal ROS (+)   Abdominal   Peds  Hematology  (+) Blood dyscrasia, anemia , Plt 216k   Anesthesia Other Findings Day of surgery medications reviewed with the patient.  Reproductive/Obstetrics S/p SVD on 10/06/16                             Anesthesia Physical  Anesthesia Plan  ASA: II  Anesthesia Plan: Epidural   Post-op Pain Management:    Induction:   Airway Management Planned:   Additional Equipment:   Intra-op Plan:   Post-operative Plan:   Informed Consent: I have reviewed the patients History and Physical, chart, labs and discussed the procedure including the risks, benefits and alternatives for the proposed anesthesia with the patient or authorized representative who has indicated his/her understanding and acceptance.   Dental advisory given  Plan Discussed with:   Anesthesia Plan Comments: (Epidural in situ.  Will attempt to get sensory level prior to proceeding to OR.  If not successful, will proceed with spinal anesthesia.)        Anesthesia Quick Evaluation

## 2016-10-08 ENCOUNTER — Encounter (HOSPITAL_COMMUNITY): Payer: Self-pay

## 2016-10-08 ENCOUNTER — Encounter: Payer: Medicaid Other | Admitting: Women's Health

## 2016-10-08 MED ORDER — PRENATAL MULTIVITAMIN CH: 1.0000 | ORAL_TABLET | Freq: Every day | ORAL | 11 refills | Status: DC

## 2016-10-08 MED ORDER — IBUPROFEN 600 MG PO TABS: 600.0000 mg | ORAL_TABLET | Freq: Four times a day (QID) | ORAL | 0 refills | Status: DC

## 2016-10-08 MED ORDER — OXYCODONE HCL 5 MG PO TABS: 5.0000 mg | ORAL_TABLET | ORAL | 0 refills | Status: DC | PRN

## 2016-10-08 NOTE — Lactation Note (Addendum)
This note was copied from a baby's chart. Nurse noted that infant had 7.5% wt loss after birth.  Nurse spoke with mother regarding supplementing with 10 mls Alimentin after each feeding.  Mother inst on  [place] feeding.  Mother states "she just finished feeding.  I'll give her the formula after I breast feed her next.     Initiated by Max Fickle Rn, IBCLC

## 2016-10-08 NOTE — Lactation Note (Signed)
This note was copied from a baby's chart. Lactation Consultation Note Baby has 8% weight loss in 29 hrs. Discussed w/mom supplementing formula after BF. Mom stated that she has good colostrum and can feel let down. Asked mom when last time she has used DEBP. Mom stated yesterday, but she will pump more today. Encouraged mom to pump after BF every three hours for lactation induction. Explained importance of stimulation since baby is 5.4 lbs. Encouraged mom to pump for supplementing and hand express to give baby colostrum, then if needed supplement w/formula. Supplementing feeding sheet given w/explaination.mom has Alimentum to give for supplementing. Discussed importance of I&O, STS, supply and demand.  Mom stated she understood plan at this time.  Patient Name: Faith Allen [id number] Date: 10/08/2016 Reason for consult: Follow-up assessment;Infant weight loss;Infant < 6lbs   Maternal Data    Feeding Feeding Type: Breast Fed Length of feed: 20 min  LATCH Score/Interventions                      Lactation Tools Discussed/Used     Consult Status Consult Status: Follow-up Date: 10/08/16 (in pm) Follow-up type: In-patient    Theodoro Kalata 10/08/2016, 6:31 AM

## 2016-10-08 NOTE — Anesthesia Postprocedure Evaluation (Signed)
Anesthesia Post Note  Patient: Faith Allen  Procedure(s) Performed: Procedure(s) (LRB): POST PARTUM TUBAL LIGATION (Bilateral)  Patient location during evaluation: PACU Anesthesia Type: Epidural Level of consciousness: awake and alert Pain management: pain level controlled Vital Signs Assessment: post-procedure vital signs reviewed and stable Respiratory status: spontaneous breathing, nonlabored ventilation and respiratory function stable Cardiovascular status: stable Postop Assessment: no headache, no backache, epidural receding, no signs of nausea or vomiting and patient able to bend at knees Anesthetic complications: no       Last Vitals:  Vitals:   10/07/16 1900 10/08/16 0639  BP: 113/66 (!) 97/52  Pulse: 84 (!) 56  Resp: 20 18  Temp: 36.5 C 36.7 C    Last Pain:  Vitals:   10/08/16 0900  TempSrc:   PainSc: [place]

## 2016-10-08 NOTE — Discharge Summary (Signed)
OB Discharge Summary  Patient Name: Faith Allen DOB: 09-Mar-1986 MRN: [id number]  Date of admission: 10/06/2016 Delivering MD: Waldemar Dickens   Date of discharge: 10/08/2016  Admitting diagnosis: 39 WKS, CTXS Desires Sterilaztion Intrauterine pregnancy: [redacted]w[redacted]d     Secondary diagnosis:Active Problems:   Normal labor  Additional problems:none     Discharge diagnosis: Term Pregnancy Delivered and post partum BTL                                                                     Post partum procedures:postpartum tubal ligation  Augmentation: n/a  Complications: None  Hospital course:  Onset of Labor With Vaginal Delivery     31 y.o. yo [id number] at [redacted]w[redacted]d was admitted in Active Labor on 10/06/2016. Patient had an uncomplicated labor course as follows:  Membrane Rupture Time/Date: 2:36 PM ,10/06/2016   Intrapartum Procedures: Episiotomy: None [1]                                         Lacerations:  None [1]  Patient had a delivery of a Viable infant. 10/06/2016  Information for the patient's newborn:  Kennady, Lansang [id number]  Delivery Method: Vaginal, Spontaneous Delivery (Filed from Delivery Summary)    Pateint had an uncomplicated postpartum course.  She is ambulating, tolerating a regular diet, passing flatus, and urinating well. Patient is discharged home in stable condition on 10/08/16.   Physical exam  Vitals:   10/07/16 1213 10/07/16 1658 10/07/16 1900 10/08/16 0639  BP: 114/67 (!) 89/48 113/66 (!) 97/52  Pulse: 63 75 84 (!) 56  Resp: 14 18 20 18   Temp: 97.9 F (36.6 C) 98.2 F (36.8 C) 97.7 F (36.5 C) 98 F (36.7 C)  TempSrc:  Oral Oral Oral  SpO2: 100%  100%   Weight:      Height:       General: alert, cooperative and no distress Lochia: appropriate Uterine Fundus: firm Incision: Healing well with no significant drainage, No significant erythema, Dressing is clean, dry, and intact DVT Evaluation: No evidence of DVT seen on physical  exam. Labs: Lab Results  Component Value Date   WBC 12.0 (H) 10/06/2016   HGB 10.6 (L) 10/06/2016   HCT 33.0 (L) 10/06/2016   MCV 71.6 (L) 10/06/2016   PLT 216 10/06/2016   CMP Latest Ref Rng & Units 12/10/2014  Glucose 70 - 99 mg/dL 87  BUN 6 - 23 mg/dL 20  Creatinine 0.50 - 1.10 mg/dL 0.66  Sodium 135 - 145 mmol/L 143  Potassium 3.5 - 5.1 mmol/L 3.6  Chloride 96 - 112 mmol/L 107  CO2 19 - 32 mmol/L 28  Calcium 8.4 - 10.5 mg/dL 8.6    Discharge instruction: per After Visit Summary and "Baby and Me Booklet".  After Visit Meds:  Allergies as of 10/08/2016   No Known Allergies     Medication List    TAKE these medications   ibuprofen 600 MG tablet Commonly known as:  ADVIL,MOTRIN Take 1 tablet (600 mg total) by mouth every 6 (six) hours.   oxyCODONE 5 MG immediate release tablet Commonly known  as:  Oxy IR/ROXICODONE Take 1 tablet (5 mg total) by mouth every 4 (four) hours as needed for moderate pain.   prenatal multivitamin Tabs tablet Take 1 tablet by mouth daily at 12 noon.       Diet: routine diet  Activity: Advance as tolerated. Pelvic rest for 6 weeks.   Outpatient follow up:1 wk  Follow up Appt:Future Appointments Date Time Provider [place]  10/08/2016 10:00 AM Roma Schanz, CNM [facility]   Follow up visit: No Follow-up on file.  Postpartum contraception: Tubal Ligation  Newborn Data: Live born female  Birth Weight: 5 lb 10.8 oz (2574 g) APGAR: 8, 9  Baby Feeding: Breast Disposition:home with mother   10/08/2016 Koren Shiver, CNM

## 2016-10-08 NOTE — Progress Notes (Signed)
CSW acknowledges consult and consult was screened out. CSW completed chart review and noted more than 3 prenatal visit. CSW consult is warranted if there are less than 3 PN visits.   Please contact the Clinical Social Worker if needs arise, or if MOB requests.  Laurey Arrow, MSW, LCSW Clinical Social Work [phone]

## 2016-10-08 NOTE — Lactation Note (Signed)
This note was copied from a baby's chart. Lactation Consultation Note  Patient Name: Girl Jireh Teitelbaum [id number] Date: 10/08/2016   Visited with Mom, baby 67 hrs old.  Baby appears to be cluster feeding this morning, and Mom states she feels baby has been latched well.  She does state that baby is more sleepy today.  Encouraged STS during feedings.  Mom had pumped 5 ml of colostrum earlier this morning.  Assisted with feeding by curved tip syringe, which baby tolerated well.  Assisted Mom with double pumping to help support her milk supply, and give her EBM back to baby via spoon or syringe.  Mom very committed to exclusive breastfeeding.  Because of initial 7.5% weight loss, and sleepiness, post pumping strongly encouraged.  Breast massage and hand expression also.  Encouraged Mom to call for assistance as needed   Broadus John 10/08/2016, 1:34 PM

## 2016-10-08 NOTE — Progress Notes (Signed)
Post discharge chart review completed.  

## 2016-10-09 ENCOUNTER — Encounter (HOSPITAL_COMMUNITY): Payer: Self-pay | Admitting: Obstetrics & Gynecology

## 2016-10-09 ENCOUNTER — Ambulatory Visit: Payer: Self-pay

## 2016-10-09 NOTE — Lactation Note (Signed)
This note was copied from a baby's chart. Lactation Consultation Note  Baby 44 hours old.  < 6 lbs.  Weight loss has stabilized. Mother latched baby in cross cradle.  Intermittent sucks and swallows observed. Mother has pumped and given baby back what she has pumped. Encouraged her to post pump at least 4 times a day for 10-15 min and give volume back to baby at next feeding. Mom encouraged to feed baby 8-12 times/24 hours and with feeding cues at least every 3 hours. Reviewed engorgement care and monitoring voids/stools. Discussed spoon feeding and hand expressing.   Patient Name: Faith Allen [id number] Date: 10/09/2016 Reason for consult: Follow-up assessment   Maternal Data    Feeding Feeding Type: Breast Fed Length of feed: 5 min  LATCH Score/Interventions Latch: Grasps breast easily, tongue down, lips flanged, rhythmical sucking.  Audible Swallowing: A few with stimulation  Type of Nipple: Everted at rest and after stimulation  Comfort (Breast/Nipple): Soft / non-tender  Problem noted: Mild/Moderate discomfort Interventions (Filling): Frequent nursing;Massage  Hold (Positioning): No assistance needed to correctly position infant at breast.  LATCH Score: 9  Lactation Tools Discussed/Used     Consult Status Consult Status: Complete Date: 10/10/16 Follow-up type: In-patient    Vivianne [facility] 10/09/2016, 8:34 AM

## 2016-10-09 NOTE — Lactation Note (Signed)
This note was copied from a baby's chart. Lactation Consultation Note Baby gained 0.08 oz. Had encouraged mom 10/08/16 to give ALimentum for supplementing after BF. Mom wants to be exclusively BF and BM. Encouraged mom to hand express and give colostrum after EVERY BF. Mom has been giving small amounts. Mom has been advised d/t SGA to give visible supplement every 3 hrs. And keep strict I&O. LC feels that mom is frustrated doing these things, going to do what mom feels she should do. Mom states milk coming in and knows baby is getting milk.  Encouraged mom to call for question or concerns.  Patient Name: Faith Allen [id number] Date: 10/09/2016 Reason for consult: Follow-up assessment;Infant < 6lbs;Infant weight loss   Maternal Data    Feeding Feeding Type: Breast Fed Length of feed:  (still BF)  LATCH Score/Interventions Latch: Grasps breast easily, tongue down, lips flanged, rhythmical sucking.  Audible Swallowing: A few with stimulation  Type of Nipple: Everted at rest and after stimulation  Comfort (Breast/Nipple): Filling, red/small blisters or bruises, mild/mod discomfort  Problem noted: Mild/Moderate discomfort Interventions (Filling): Frequent nursing;Massage  Hold (Positioning): No assistance needed to correctly position infant at breast.  LATCH Score: 8  Lactation Tools Discussed/Used     Consult Status Consult Status: Follow-up Date: 10/10/16 Follow-up type: In-patient    Massiel Stipp, Elta Gu[place]oupe 10/09/2016, 6:16 AM

## 2016-10-12 ENCOUNTER — Encounter: Payer: Self-pay | Admitting: Obstetrics & Gynecology

## 2016-10-12 ENCOUNTER — Ambulatory Visit (INDEPENDENT_AMBULATORY_CARE_PROVIDER_SITE_OTHER): Payer: Medicaid Other | Admitting: Obstetrics & Gynecology

## 2016-10-12 VITALS — BP 110/70 | HR 72 | Wt 145.0 lb

## 2016-10-12 DIAGNOSIS — Z302 Encounter for sterilization: Secondary | ICD-10-CM

## 2016-10-12 DIAGNOSIS — Z9889 Other specified postprocedural states: Secondary | ICD-10-CM

## 2016-10-12 NOTE — Progress Notes (Signed)
  HPI: Patient returns for routine postoperative follow-up having undergone post partum bilateral tubal ligation on 10/07/2016.  The patient's immediate postoperative recovery has been unremarkable. Since hospital discharge the patient reports some pain gassy.   Current Outpatient Prescriptions: ibuprofen (ADVIL,MOTRIN) 600 MG tablet, Take 1 tablet (600 mg total) by mouth every 6 (six) hours., Disp: 30 tablet, Rfl: 0 Prenatal Vit-Fe Fumarate-FA (PRENATAL MULTIVITAMIN) TABS tablet, Take 1 tablet by mouth daily at 12 noon., Disp: 30 tablet, Rfl: 11  No current facility-administered medications for this visit.     Blood pressure 110/70, pulse 72, weight 145 lb (65.8 kg), unknown if currently breastfeeding.  Physical Exam: Incision clean dry intact  Diagnostic Tests:   Pathology: benign  Impression: S/p pp btl  Plan: Routine post op post partum care  Follow up: 4  weeks  Florian Buff, MD

## 2016-11-16 ENCOUNTER — Encounter: Payer: Self-pay | Admitting: Women's Health

## 2016-11-16 ENCOUNTER — Ambulatory Visit (INDEPENDENT_AMBULATORY_CARE_PROVIDER_SITE_OTHER): Payer: Medicaid Other | Admitting: Women's Health

## 2016-11-16 DIAGNOSIS — Z9851 Tubal ligation status: Secondary | ICD-10-CM

## 2016-11-16 NOTE — Progress Notes (Signed)
Subjective:    Faith Allen is a 31 y.o. [phone] Hispanic female who presents for a postpartum visit. She is 6 weeks postpartum following a spontaneous vaginal delivery at 38.5 gestational weeks and BTL. Anesthesia: epidural. I have fully reviewed the prenatal and intrapartum course. Postpartum course has been uncomplicated. Baby's course has been uncomplicated. Baby is feeding by breast. Bleeding no bleeding. Bowel function is some constipation. Bladder function is normal. Patient is not sexually active. Last sexual activity: prior to birth of baby. Contraception method is tubal ligation. Postpartum depression screening: negative. Score 2.  Last pap 2015 and was normal. Feels BTL incision feels hard underneath, no pain.   The following portions of the patient's history were reviewed and updated as appropriate: allergies, current medications, past medical history, past surgical history and problem list.  Review of Systems Pertinent items are noted in HPI.   Vitals:   11/16/16 1031  BP: 100/64  Pulse: 62  Weight: 133 lb 8 oz (60.6 kg)  Height: 5' (1.524 m)   No LMP recorded (exact date).  Objective:   General:  alert, cooperative and no distress   Breasts:  deferred, no complaints  Lungs: clear to auscultation bilaterally  Heart:  regular rate and rhythm  Abdomen: soft, nontender, BTL incision well-healed, no induration superficial, small slightly firm area deep under incision- no pain w/ palpation- no s/s infection   Vulva: normal  Vagina: normal vagina  Cervix:  closed  Corpus: Well-involuted  Adnexa:  Non-palpable  Rectal Exam: No hemorrhoids        Assessment:   Postpartum exam 6 wks s/p SVB w/ BTL Breastfeeding Constipation Depression screening Contraception counseling   Plan:  Contraception: tubal ligation Follow up in: 2 months for pap & physical, or earlier if needed Gave printed info on constipation  Tawnya Crook CNM, WHNP-BC 11/16/2016 10:40 AM

## 2016-11-16 NOTE — Patient Instructions (Signed)

## 2017-01-16 ENCOUNTER — Other Ambulatory Visit: Payer: Medicaid Other | Admitting: Women's Health

## 2018-05-29 ENCOUNTER — Other Ambulatory Visit: Payer: Self-pay

## 2018-05-29 ENCOUNTER — Ambulatory Visit: Payer: No Typology Code available for payment source | Admitting: Obstetrics & Gynecology

## 2018-05-29 ENCOUNTER — Encounter: Payer: Self-pay | Admitting: Obstetrics & Gynecology

## 2018-05-29 VITALS — BP 115/52 | HR 78 | Ht 60.0 in | Wt 144.0 lb

## 2018-05-29 DIAGNOSIS — K2100 Gastro-esophageal reflux disease with esophagitis, without bleeding: Secondary | ICD-10-CM

## 2018-05-29 DIAGNOSIS — K829 Disease of gallbladder, unspecified: Secondary | ICD-10-CM

## 2018-05-29 MED ORDER — PROMETHAZINE HCL 25 MG PO TABS
25.0000 mg | ORAL_TABLET | Freq: Four times a day (QID) | ORAL | 1 refills | Status: DC | PRN
Start: 2018-05-29 — End: 2019-08-03

## 2018-05-29 MED ORDER — OMEPRAZOLE 20 MG PO CPDR: 20.0000 mg | DELAYED_RELEASE_CAPSULE | Freq: Every day | ORAL | 6 refills | Status: DC

## 2018-05-29 NOTE — Progress Notes (Signed)
Chief Complaint  Patient presents with  . Abdominal Pain    thinks she has a "bladder infection"      32 y.o. [id number] No LMP recorded. The current method of family planning is tubal ligation.  Outpatient Encounter Medications as of 05/29/2018  Medication Sig  . omeprazole (PRILOSEC) 20 MG capsule Take 1 capsule (20 mg total) by mouth daily. 1 tablet a day  . Prenatal Vit-Fe Fumarate-FA (PRENATAL MULTIVITAMIN) TABS tablet Take 1 tablet by mouth daily at 12 noon. (Patient not taking: Reported on 05/29/2018)  . promethazine (PHENERGAN) 25 MG tablet Take 1 tablet (25 mg total) by mouth every 6 (six) hours as needed for nausea or vomiting.  Marland Kitchen UNABLE TO FIND Enzyme-1 after heavy meals; Probiotic gummy-takes daily   No facility-administered encounter medications on file as of 05/29/2018.     Subjective Began having mid epigastric pain upon awakening today, moderate to severe, comes in waves No fever Some nausea no emesis Had salad and chicken last night No known previous GB attacks or issues or any similar episode Had a mild episode during one of her pregnancies Past Medical History:  Diagnosis Date  . Medical history non-contributory     Past Surgical History:  Procedure Laterality Date  . NO PAST SURGERIES    . TUBAL LIGATION Bilateral 10/07/2016   Procedure: POST PARTUM TUBAL LIGATION;  Surgeon: Florian Buff, MD;  Location: [facility];  Service: Gynecology;  Laterality: Bilateral;    OB History    Gravida  4   Para  4   Term  4   Preterm      AB      Living  4     SAB      TAB      Ectopic      Multiple  0   Live Births  4           No Known Allergies  Social History   Socioeconomic History  . Marital status: Married    Spouse name: Not on file  . Number of children: 3  . Years of education: Not on file  . Highest education level: Not on file  Occupational History  . Occupation: PT-Daycare  Social Needs  . Financial resource strain: Not  on file  . Food insecurity:    Worry: Not on file    Inability: Not on file  . Transportation needs:    Medical: Not on file    Non-medical: Not on file  Tobacco Use  . Smoking status: Former Smoker    Years: 1.00    Types: Cigarettes  . Smokeless tobacco: Never Used  Substance and Sexual Activity  . Alcohol use: Yes    Alcohol/week: 0.0 standard drinks    Comment: occ  . Drug use: No  . Sexual activity: Not Currently    Birth control/protection: Surgical    Comment: tubal  Lifestyle  . Physical activity:    Days per week: Not on file    Minutes per session: Not on file  . Stress: Not on file  Relationships  . Social connections:    Talks on phone: Not on file    Gets together: Not on file    Attends religious service: Not on file    Active member of club or organization: Not on file    Attends meetings of clubs or organizations: Not on file    Relationship status: Not on file  Other Topics Concern  .  Not on file  Social History Narrative  . Not on file    Family History  Problem Relation Age of Onset  . Diabetes Mother   . Miscarriages / Stillbirths Sister   . Irritable bowel syndrome Sister   . Inflammatory bowel disease Neg Hx   . Colon cancer Neg Hx     Medications:       Current Outpatient Medications:  .  omeprazole (PRILOSEC) 20 MG capsule, Take 1 capsule (20 mg total) by mouth daily. 1 tablet a day, Disp: 30 capsule, Rfl: 6 .  Prenatal Vit-Fe Fumarate-FA (PRENATAL MULTIVITAMIN) TABS tablet, Take 1 tablet by mouth daily at 12 noon. (Patient not taking: Reported on 05/29/2018), Disp: 30 tablet, Rfl: 11 .  promethazine (PHENERGAN) 25 MG tablet, Take 1 tablet (25 mg total) by mouth every 6 (six) hours as needed for nausea or vomiting., Disp: 30 tablet, Rfl: 1 .  UNABLE TO FIND, Enzyme-1 after heavy meals; Probiotic gummy-takes daily, Disp: , Rfl:   Objective Blood pressure (!) 115/52, pulse 78, height 5' (1.524 m), weight 144 lb (65.3 kg), currently  breastfeeding.  Gen WDWN some distress had a wave of pain while in the office Abdomen soft benign No RUQ tenderness no back or CVAT tenderness on exam, pain not worse with pressing  Pertinent ROS No burning with urination, frequency or urgency No nausea, vomiting or diarrhea Nor fever chills or other constitutional symptoms   Labs or studies UA negative    Impression Diagnoses this Encounter::   ICD-10-CM   1. Gastroesophageal reflux disease with esophagitis K21.0   2. Gallbladder attack K82.9    possible, initial    Established relevant diagnosis(es):   Plan/Recommendations: Meds ordered this encounter  Medications  . omeprazole (PRILOSEC) 20 MG capsule    Sig: Take 1 capsule (20 mg total) by mouth daily. 1 tablet a day    Dispense:  30 capsule    Refill:  6  . promethazine (PHENERGAN) 25 MG tablet    Sig: Take 1 tablet (25 mg total) by mouth every 6 (six) hours as needed for nausea or vomiting.    Dispense:  30 tablet    Refill:  1    Labs or Scans Ordered: No orders of the defined types were placed in this encounter.   Management:: >omeprazole daily if this is gasttis, GERD >phenergan if she develops nausea vomiting and if this is initial presentation for GB disease  If symptoms persists or worsen go to ED for labs and sonogram eval for possible GB disease  Follow up Return if symptoms worsen or fail to improve.     All questions were answered.

## 2019-06-24 ENCOUNTER — Other Ambulatory Visit: Payer: Self-pay | Admitting: *Deleted

## 2019-06-24 DIAGNOSIS — Z20822 Contact with and (suspected) exposure to covid-19: Secondary | ICD-10-CM

## 2019-06-25 LAB — NOVEL CORONAVIRUS, NAA: SARS-CoV-2, NAA: NOT DETECTED

## 2019-08-03 ENCOUNTER — Encounter (INDEPENDENT_AMBULATORY_CARE_PROVIDER_SITE_OTHER): Payer: Self-pay | Admitting: Nurse Practitioner

## 2019-08-03 ENCOUNTER — Ambulatory Visit (INDEPENDENT_AMBULATORY_CARE_PROVIDER_SITE_OTHER): Payer: No Typology Code available for payment source | Admitting: Nurse Practitioner

## 2019-08-03 ENCOUNTER — Other Ambulatory Visit: Payer: Self-pay

## 2019-08-03 VITALS — BP 106/68 | HR 66 | Temp 98.2°F | Resp 14 | Ht 60.0 in | Wt 147.2 lb

## 2019-08-03 DIAGNOSIS — Z131 Encounter for screening for diabetes mellitus: Secondary | ICD-10-CM

## 2019-08-03 DIAGNOSIS — R5383 Other fatigue: Secondary | ICD-10-CM

## 2019-08-03 DIAGNOSIS — R41840 Attention and concentration deficit: Secondary | ICD-10-CM

## 2019-08-03 DIAGNOSIS — Z13220 Encounter for screening for lipoid disorders: Secondary | ICD-10-CM | POA: Diagnosis not present

## 2019-08-03 DIAGNOSIS — Z0001 Encounter for general adult medical examination with abnormal findings: Secondary | ICD-10-CM | POA: Diagnosis not present

## 2019-08-03 NOTE — Assessment & Plan Note (Signed)
Health maintenance recommendations that she is due for include flu shot.  She does not want the flu shot today.  She has had her tetanus shot.  I do not believe she qualifies for pneumonia vaccine at this time.  She believes her last Pap smear was in 2018, and tells me she plans on calling OB/GYN as she recently got medical insurance.She does not want to have a sexual transmitted infection screening done today.  She is already quit smoking.  As stated above depression screening was positive, but she feels this is very situational.  See plan under attention and concentration deficit below for further information.  She will consider hepatitis C screening, but does not want this done today.  Blood work collected today include CBC, CMP, lipid panel, vitamin D, A1c, and thyroid panel for screenings as well as further investigation regarding her concerns with attention.  Physical exam was within normal limits.  She will follow-up in 1 week for office visit and then again in about 1 year for annual physical exam.

## 2019-08-03 NOTE — Progress Notes (Signed)
Subjective:  [name], female    DOB: [birth year]  Age: 33 y.o. MRN: [id number]  CC:  Chief Complaint  Patient presents with  . other    Difficulty with attention      HPI  This patient arrives to this office today for new patient to be established this practice.  We discussed her health maintenance.  She is due for flu shot but does not want this done today.  Last tetanus was received with her last pregnancy in 2018.  She believes her last Pap smear was completed 2018 with [facility].  She tells me she is planning on calling them to schedule another annual exam with them as she just recently received insurance.  She is not on folic acid supplement, but risk of unplanned pregnancy is low due to tubal ligation.  She is already quit smoking tobacco.  She is due for depression screening.  She would qualify for hepatitis C screening.  She also mentions to me that she believes she has adult ADD.  She believes she was diagnosed with this as a child.  She tells me she seems to have a difficult time with focus and concentration, and this is very bothersome for her.  She tells me she has really been bothered by this since having to be socially distanced secondary to the coronavirus pandemic.  The realization that she may have adult ADD is very bothersome for her and seems to also be depressing her mood.  She denies any suicidal ideation today.  Past Medical History:  Diagnosis Date  . Attention deficit disorder   . Medical history non-contributory       Family History  Problem Relation Age of Onset  . Diabetes Mother   . Asthma Mother   . Miscarriages / Stillbirths Sister   . Irritable bowel syndrome Sister   . Irritable bowel syndrome Sister   . Anxiety disorder Sister   . Inflammatory bowel disease Neg Hx   . Colon cancer Neg Hx     Social History   Social History Narrative  . Not on file   Social History   Tobacco Use  . Smoking status: Former  Smoker    Years: 1.00    Types: Cigarettes    Quit date: 07/05/2007    Years since quitting: 12.0  . Smokeless tobacco: Never Used  . Tobacco comment: Would smoke socially  Substance Use Topics  . Alcohol use: Yes    Alcohol/week: 3.0 standard drinks    Types: 2 Glasses of wine, 1 Shots of liquor per week    Comment: Once a month     Current Meds  Medication Sig  . Emollient (ROC RETINOL CORREXION) CREA Apply topically.    ROS:  Review of Systems  Constitutional: Negative for chills, fever and weight loss (+) weight gain.  HENT: Negative for congestion, ear discharge, ear pain and hearing loss.   Eyes: Positive for blurred vision. Negative for double vision and pain.  Respiratory: Positive for shortness of breath (Intermittent and mild, patient feels related to weight gain). Negative for cough and wheezing.   Cardiovascular: Negative for chest pain and palpitations.  Gastrointestinal: Negative for abdominal pain, blood in stool, diarrhea, heartburn, nausea and vomiting.  Genitourinary: Negative for dysuria, frequency and hematuria.  Musculoskeletal: Positive for joint pain (left leg pain; intermittent; no weakness, no sensation changes) and neck pain (pain in right shoulder for 3-4 days; resolved over the last day).  Negative for back pain and myalgias.  Skin: Negative for rash.  Neurological: Positive for headaches (onset 1-2 years ago; midol will relieve headache; occurs with menstration but si). Negative for dizziness and weakness.  Endo/Heme/Allergies: Does not bruise/bleed easily.  Psychiatric/Behavioral: Negative for depression (Difficulty with focus and concentration, difficulty concentrating, fatigue at times).     Objective:   Today's Vitals: BP 106/68   Pulse 66   Temp 98.2 F (36.8 C)   Resp 14   Ht 5' (1.524 m)   Wt 147 lb 3.2 oz (66.8 kg)   LMP 07/28/2019   SpO2 99%   BMI 28.75 kg/m  Vitals with BMI 08/03/2019 05/29/2018 11/16/2016  Height 5\' 0"  5\' 0"  5\' 0"    Weight 147 lbs 3 oz 144 lbs 133 lbs 8 oz  BMI 28.75 [id number]  Systolic [id number]  Diastolic 68 52 64  Pulse 66 78 62     Physical Exam Vitals signs reviewed.  Constitutional:      Appearance: Normal appearance.  HENT:     Head: Normocephalic and atraumatic.  Eyes:     Extraocular Movements: Extraocular movements intact.     Comments: Bilateral pterygium  Neck:     Musculoskeletal: Neck supple. No muscular tenderness.  Cardiovascular:     Rate and Rhythm: Normal rate and regular rhythm.     Pulses: Normal pulses.     Heart sounds: Normal heart sounds. No murmur.  Pulmonary:     Effort: Pulmonary effort is normal.     Breath sounds: Normal breath sounds.  Abdominal:     General: Abdomen is flat. Bowel sounds are normal.     Palpations: Abdomen is soft. There is no mass.     Tenderness: There is no abdominal tenderness.  Musculoskeletal:        General: No swelling or tenderness.  Lymphadenopathy:     Cervical: No cervical adenopathy.  Skin:    General: Skin is warm and dry.  Neurological:     General: No focal deficit present.     Mental Status: She is alert and oriented to person, place, and time.     Sensory: No sensory deficit.     Motor: No weakness.     Gait: Gait normal.  Psychiatric:        Behavior: Behavior normal.        Thought Content: Thought content normal.        Judgment: Judgment normal.     Comments: Tearfulness at times during exam    ASRS Scale: Positive on 4 items in Part A.  The positive items involved difficulty wrapping up final details of a project, difficulty getting things done in an orderly fashion, difficulty remembering appointments/obligations, avoiding getting started on a task that requires a lot of thought.  Positive on 5 items in part B.  The positive items involved difficulty keeping attention when doing boring or repetitive work, difficulty concentrating on what people are saying, being distracted by activity or noise around  her, talking too much in social situations, and finishing the sentences of other people.  PHQ9: 14/27. Negative for item 9.   Assessment   1. Fatigue, unspecified type   2. Lack of concentration   3. Encounter for general adult medical examination with abnormal findings   4. Screening for diabetes mellitus   5. Lipid screening   6. Attention and concentration deficit       Tests ordered Orders Placed This Encounter  Procedures  .  CBC  . CMP  . Lipid Panel  . Vitamin D, 25-hydroxy  . Hemoglobin A1c  . TSH  . T4, Free  . T3, Free  . Vitamin D, 25-hydroxy     Plan: Please see assessment and plan per problem list below.   No orders of the defined types were placed in this encounter. In addition to performing an annual physical exam I also performed an office visit to address her concerns as stated above.  Patient to follow-up in 1 week.  Ailene Ards, NP

## 2019-08-03 NOTE — Assessment & Plan Note (Signed)
We will collect blood work for initial evaluation.  Further recommendations we made based upon these results.  Patient very well may have adult ADD or ADHD.  It appears she also has some underlying mood disorder such as depression.  She did state that her mood is heavily affected by the realization that she may have adult ADD.  Thus her depression may be situational at this time.  She will follow-up in approximately 1 week to determine next steps.  She was encouraged to call this office any questions or concerns prior to this next upcoming appointment.

## 2019-08-03 NOTE — Assessment & Plan Note (Signed)
We will collect blood work for further evaluation initially.  Further recommendations will be made based upon this.

## 2019-08-03 NOTE — Patient Instructions (Signed)
Thank you for choosing [facility] as your medical provider! If you have any questions or concerns regarding your health care, please do not hesitate to call our office.  As we discussed in the office we will first collect blood work for further evaluation of your concerns.  We will follow up with you in approximately 1 month virtually.  If you have any questions or concerns prior to that appointment please do not hesitate to call our office at [phone].  If you need referrals to your OB/GYN provider, let me know and I can refer you.  If you decide you would like to undergo hepatitis C screening please let us know at your next office visit and we can schedule you for blood work.  It was a pleasure meeting you today!  Please follow-up as scheduled in approximately 1 month. We look forward to seeing you again soon!  At [facility] we value your feedback. You may receive a survey about your visit today. Please share your experience as we strive to create trusting relationships with our patients to provide genuine, compassionate, quality care.  We appreciate your understanding and patience as we review any laboratory studies, imaging, and other diagnostic tests that are ordered as we care for you. We do our best to address any and all results in a timely manner. If you do not hear about test results within 1 week, please do not hesitate to contact us. If we referred you to a specialist during your visit or ordered imaging testing, contact the office if you have not been contacted to be scheduled within 1 weeks.  We also encourage the use of MyChart, which contains your medical information for your review as well. If you are not enrolled in this feature, an access code is on this after visit summary for your convenience. Thank you for allowing [place] to be involved in your care.

## 2019-08-04 LAB — HEMOGLOBIN A1C
Hgb A1c MFr Bld: 5.4 % of total Hgb (ref <5.7)
Mean Plasma Glucose: 108 (calc)
eAG (mmol/L): 6 (calc)

## 2019-08-04 LAB — CBC
HCT: 35.9 % (ref 35.0–45.0)
Hemoglobin: 11.4 g/dL — ABNORMAL LOW (ref 11.7–15.5)
MCH: 24.5 pg — ABNORMAL LOW (ref 27.0–33.0)
MCHC: 31.8 g/dL — ABNORMAL LOW (ref 32.0–36.0)
MCV: 77 fL — ABNORMAL LOW (ref 80.0–100.0)
MPV: 11.5 fL (ref 7.5–12.5)
Platelets: 271 10*3/uL (ref 140–400)
RBC: 4.66 10*6/uL (ref 3.80–5.10)
RDW: 14.5 % (ref 11.0–15.0)
WBC: 5.2 10*3/uL (ref 3.8–10.8)

## 2019-08-04 LAB — T3, FREE: T3, Free: 3.5 pg/mL (ref 2.3–4.2)

## 2019-08-04 LAB — COMPREHENSIVE METABOLIC PANEL
AG Ratio: 1.4 (calc) (ref 1.0–2.5)
ALT: 11 U/L (ref 6–29)
AST: 13 U/L (ref 10–30)
Albumin: 4.2 g/dL (ref 3.6–5.1)
Alkaline phosphatase (APISO): 64 U/L (ref 31–125)
BUN: 14 mg/dL (ref 7–25)
CO2: 25 mmol/L (ref 20–32)
Calcium: 9.4 mg/dL (ref 8.6–10.2)
Chloride: 105 mmol/L (ref 98–110)
Creat: 0.6 mg/dL (ref 0.50–1.10)
Globulin: 3 g/dL (calc) (ref 1.9–3.7)
Glucose, Bld: 84 mg/dL (ref 65–99)
Potassium: 4.3 mmol/L (ref 3.5–5.3)
Sodium: 140 mmol/L (ref 135–146)
Total Bilirubin: 0.3 mg/dL (ref 0.2–1.2)
Total Protein: 7.2 g/dL (ref 6.1–8.1)

## 2019-08-04 LAB — LIPID PANEL
Cholesterol: 146 mg/dL (ref <200)
HDL: 46 mg/dL — ABNORMAL LOW (ref >=50)
LDL Cholesterol (Calc): 80 mg/dL (calc)
Non-HDL Cholesterol (Calc): 100 mg/dL (calc) (ref <130)
Total CHOL/HDL Ratio: 3.2 (calc) (ref <5.0)
Triglycerides: 108 mg/dL (ref <150)

## 2019-08-04 LAB — T4, FREE: Free T4: 1.1 ng/dL (ref 0.8–1.8)

## 2019-08-04 LAB — TSH: TSH: 1.54 mIU/L

## 2019-08-04 LAB — VITAMIN D 25 HYDROXY (VIT D DEFICIENCY, FRACTURES): Vit D, 25-Hydroxy: 17 ng/mL — ABNORMAL LOW (ref 30–100)

## 2019-08-04 NOTE — Progress Notes (Signed)
Patient's labs have resulted.  They do show that she is deficient in vitamin D as well as slightly anemic.  Blood work was collected shortly after her finishing her menstrual cycle.  We will need to consider further investigation her anemia at her next office visit as well as discuss starting vitamin D3 supplementation.

## 2019-08-11 ENCOUNTER — Telehealth (INDEPENDENT_AMBULATORY_CARE_PROVIDER_SITE_OTHER): Payer: No Typology Code available for payment source | Admitting: Internal Medicine

## 2019-08-11 ENCOUNTER — Other Ambulatory Visit: Payer: Self-pay

## 2019-08-11 ENCOUNTER — Encounter (INDEPENDENT_AMBULATORY_CARE_PROVIDER_SITE_OTHER): Payer: Self-pay | Admitting: Internal Medicine

## 2019-08-11 DIAGNOSIS — R41840 Attention and concentration deficit: Secondary | ICD-10-CM | POA: Diagnosis not present

## 2019-08-11 DIAGNOSIS — R5383 Other fatigue: Secondary | ICD-10-CM | POA: Diagnosis not present

## 2019-08-11 DIAGNOSIS — E559 Vitamin D deficiency, unspecified: Secondary | ICD-10-CM | POA: Diagnosis not present

## 2019-08-11 NOTE — Progress Notes (Signed)
Metrics: Intervention Frequency ACO  Documented Smoking Status Yearly  Screened one or more times in 24 months  Cessation Counseling or  Active cessation medication Past 24 months  Past 24 months   Guideline developer: UpToDate (See UpToDate for funding source) Date Released: 2014       Wellness Office Visit  Subjective:  [name], female    DOB: 02-08-1986  Age: 33 y.o. MRN: [id number]  CC: This is an audio telemedicine visit with the permission of the patient who is at home and I am in my office.  This visit is to follow-up on her blood work from her initial visit with Judson Roch, Designer, jewellery. HPI  I reviewed all the blood work with her.  She has vitamin D deficiency.  She also has microcytic anemia which is likely related to her menorrhagia. Remaining blood work is unremarkable. Past Medical History:  Diagnosis Date  . Attention deficit disorder   . Medical history non-contributory       Family History  Problem Relation Age of Onset  . Diabetes Mother   . Asthma Mother   . Miscarriages / Stillbirths Sister   . Irritable bowel syndrome Sister   . Irritable bowel syndrome Sister   . Anxiety disorder Sister   . Inflammatory bowel disease Neg Hx   . Colon cancer Neg Hx     Social History   Social History Narrative  . Not on file   Social History   Tobacco Use  . Smoking status: Former Smoker    Years: 1.00    Types: Cigarettes    Quit date: 07/05/2007    Years since quitting: 12.1  . Smokeless tobacco: Never Used  . Tobacco comment: Would smoke socially  Substance Use Topics  . Alcohol use: Yes    Alcohol/week: 3.0 standard drinks    Types: 2 Glasses of wine, 1 Shots of liquor per week    Comment: Once a month    Current Meds  Medication Sig  . Emollient (ROC RETINOL CORREXION) CREA Apply topically.       Objective:   Today's Vitals: LMP 07/28/2019  Vitals with BMI 08/11/2019 08/03/2019 05/29/2018  Height (No Data) 5\' 0"  5\' 0"    Weight (No Data) 147 lbs 3 oz 144 lbs  BMI - [id number]  Systolic (No Data) [id number]  Diastolic (No Data) 68 52  Pulse - 66 78     Physical Exam   Her speech was normal on the phone and she appeared to be alert and orientated.    Assessment   1. Fatigue, unspecified type   2. Attention and concentration deficit   3. Vitamin D deficiency disease       Tests ordered Orders Placed This Encounter  Procedures  . Ambulatory referral to Psychiatry     Plan: 1. I recommended she start taking vitamin D3 10,000 units daily. 2. I will refer her to [place] [facility] for her attention and concentration issues. 3. I also recommended following nutrition plan that is on her my chart now. 4. I will follow up with her in a couple of months to see how she is doing and we will check vitamin D levels then.   No orders of the defined types were placed in this encounter.   Doree Albee, MD

## 2019-08-11 NOTE — Patient Instructions (Signed)
[facility] Dietary Recommendations for Weight Loss What to Avoid . Avoid added sugars o Often added sugar can be found in processed foods such as many condiments, dry cereals, cakes, cookies, chips, crisps, crackers, candies, sweetened drinks, etc.  o Read labels and AVOID/DECREASE use of foods with the following in their ingredient list: Sugar, fructose, high fructose corn syrup, sucrose, glucose, maltose, dextrose, molasses, cane sugar, brown sugar, any type of syrup, agave nectar, etc.   . Avoid snacking in between meals . Avoid foods made with flour o If you are going to eat food made with flour, choose those made with whole-grains; and, minimize your consumption as much as is tolerable . Avoid processed foods o These foods are generally stocked in the middle of the grocery store. Focus on shopping on the perimeter of the grocery.  What to Include . Vegetables o GREEN LEAFY VEGETABLES: Kale, spinach, mustard greens, collard greens, cabbage, broccoli, etc. o OTHER: Asparagus, cauliflower, eggplant, carrots, peas, Brussel sprouts, tomatoes, bell peppers, zucchini, beets, cucumbers, etc. . Grains, seeds, and legumes o Beans: kidney beans, black eyed peas, garbanzo beans, black beans, pinto beans, etc. o Whole, unrefined grains: brown rice, barley, bulgur, oatmeal, etc. . Healthy fats  o Avoid highly processed fats such as vegetable oil o Examples of healthy fats: avocado, olives, virgin olive oil, dark chocolate (?72% Cocoa), nuts (peanuts, almonds, walnuts, cashews, pecans, etc.) . Low - Moderate Intake of Animal Sources of Protein o Meat sources: chicken, turkey, salmon, tuna. Limit to 4 ounces of meat at one time. o Consider limiting dairy sources, but when choosing dairy focus on: PLAIN Greek yogurt, cottage cheese, high-protein milk . Fruit o Choose berries  When to Eat . Intermittent Fasting: o Choosing not to eat for a specific time period, but DO FOCUS ON HYDRATION  when fasting o Multiple Techniques: - Time Restricted Eating: eat 3 meals in a day, each meal lasting no more than 60 minutes, no snacks between meals - 16-18 hour fast: fast for 16 to 18 hours up to 7 days a week. Often suggested to start with 2-3 nonconsecutive days per week.  . Remember the time you sleep is counted as fasting.  . Examples of eating schedule: Fast from 7:00pm-11:00am. Eat between 11:00am-7:00pm.  - 24-hour fast: fast for 24 hours up to every other day. Often suggested to start with 1 day per week . Remember the time you sleep is counted as fasting . Examples of eating schedule:  o Eating day: eat 2-3 meals on your eating day. If doing 2 meals, each meal should last no more than 90 minutes. If doing 3 meals, each meal should last no more than 60 minutes. Finish last meal by 7:00pm. o Fasting day: Fast until 7:00pm.  o IF YOU FEEL UNWELL FOR ANY REASON/IN ANY WAY WHEN FASTING, STOP FASTING BY EATING A NUTRITIOUS SNACK OR LIGHT MEAL o ALWAYS FOCUS ON HYDRATION DURING FASTS - Acceptable Hydration sources: water, broths, tea/coffee (black tea/coffee is best but using a small amount of whole-fat dairy products in coffee/tea is acceptable).  - Poor Hydration Sources: anything with sugar or artificial sweeteners added to it  These recommendations have been developed for patients that are actively receiving medical care from either Dr. [facility] or Sarah Gray, DNP, NP-C at [facility]. These recommendations are developed for patients with specific medical conditions and are not meant to be distributed or used by others that are not actively receiving care from either provider listed   above at [facility]. It is not appropriate to participate in the above eating plans without proper medical supervision.   Reference: Rexanne Mano. The obesity code. [place]; 2016.       As discussed, take vitamin D3 10,000 units daily

## 2019-09-08 ENCOUNTER — Ambulatory Visit: Payer: No Typology Code available for payment source | Attending: Internal Medicine

## 2019-09-08 ENCOUNTER — Other Ambulatory Visit: Payer: Self-pay

## 2019-09-08 DIAGNOSIS — Z20822 Contact with and (suspected) exposure to covid-19: Secondary | ICD-10-CM

## 2019-09-10 LAB — NOVEL CORONAVIRUS, NAA: SARS-CoV-2, NAA: NOT DETECTED

## 2019-10-21 ENCOUNTER — Ambulatory Visit
Admission: EM | Admit: 2019-10-21 | Discharge: 2019-10-21 | Disposition: A | Discharge status: Home / Self Care | Payer: PRIVATE HEALTH INSURANCE | Attending: Emergency Medicine | Admitting: Emergency Medicine

## 2019-10-21 DIAGNOSIS — T300 Burn of unspecified body region, unspecified degree: Secondary | ICD-10-CM | POA: Diagnosis not present

## 2019-10-21 MED ORDER — MUPIROCIN CALCIUM 2 % EX CREA: 1.0000 "application " | TOPICAL_CREAM | Freq: Two times a day (BID) | CUTANEOUS | 0 refills | Status: DC

## 2019-10-21 MED ORDER — IBUPROFEN 800 MG PO TABS: 800.0000 mg | ORAL_TABLET | Freq: Three times a day (TID) | ORAL | 0 refills | Status: DC
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
## 2019-10-21 NOTE — Discharge Instructions (Addendum)
Advised patient to take ibuprofen as prescribed for pain Bactroban was prescribed Keep burn area clean by washing with water and mild soap daily Apply dry nonsticking gauze to prevent friction To follow-up with primary care To return for worsening of symptoms

## 2019-10-23 ENCOUNTER — Telehealth: Payer: Self-pay

## 2019-10-23 NOTE — Telephone Encounter (Signed)
Pt's pharmacy sent fax to request change in prescription d/t insurance. Pt's pharmacy called and verbal order given by [place], PA to change mupirocin cream to ointment. Pt called and informed of medication change.

## 2019-11-04 ENCOUNTER — Ambulatory Visit (INDEPENDENT_AMBULATORY_CARE_PROVIDER_SITE_OTHER): Payer: No Typology Code available for payment source | Admitting: Internal Medicine

## 2019-12-03 ENCOUNTER — Ambulatory Visit (INDEPENDENT_AMBULATORY_CARE_PROVIDER_SITE_OTHER): Payer: No Typology Code available for payment source | Admitting: Nurse Practitioner

## 2019-12-15 ENCOUNTER — Telehealth (INDEPENDENT_AMBULATORY_CARE_PROVIDER_SITE_OTHER): Payer: Self-pay

## 2019-12-15 NOTE — Telephone Encounter (Signed)
Routing to [facility] for referral process

## 2019-12-15 NOTE — Telephone Encounter (Signed)
Okay, please refer her to this practice in that case.  Thanks.

## 2019-12-15 NOTE — Telephone Encounter (Signed)
Faith Allen's insurance did not approve for her to be seen at [facility] in [place] but will approve for her to be seen by Dr. Modesta Messing, Elie Goody located at [place] Fax# [phone] please advise?

## 2019-12-21 ENCOUNTER — Ambulatory Visit: Payer: No Typology Code available for payment source | Attending: Internal Medicine

## 2019-12-21 ENCOUNTER — Other Ambulatory Visit: Payer: Self-pay

## 2019-12-21 DIAGNOSIS — Z20822 Contact with and (suspected) exposure to covid-19: Secondary | ICD-10-CM

## 2019-12-22 LAB — SARS-COV-2, NAA 2 DAY TAT

## 2019-12-22 LAB — NOVEL CORONAVIRUS, NAA: SARS-CoV-2, NAA: NOT DETECTED

## 2019-12-23 ENCOUNTER — Telehealth: Payer: Self-pay | Admitting: General Practice

## 2019-12-23 NOTE — Telephone Encounter (Signed)
Pt aware covid test results negative.

## 2020-01-20 ENCOUNTER — Other Ambulatory Visit (INDEPENDENT_AMBULATORY_CARE_PROVIDER_SITE_OTHER): Payer: Self-pay

## 2020-01-20 DIAGNOSIS — R41840 Attention and concentration deficit: Secondary | ICD-10-CM

## 2020-01-26 NOTE — Progress Notes (Signed)
Virtual Visit via Video Note  I connected with [name] on 01/27/20 at  1:00 PM EDT by a video enabled telemedicine application and verified that I am speaking with the correct person using two identifiers.   I discussed the limitations of evaluation and management by telemedicine and the availability of in person appointments. The patient expressed understanding and agreed to proceed.  History of Present Illness  I discussed the assessment and treatment plan with the patient. The patient was provided an opportunity to ask questions and all were answered. The patient agreed with the plan and demonstrated an understanding of the instructions.   The patient was advised to call back or seek an in-person evaluation if the symptoms worsen or if the condition fails to improve as anticipated.  I provided 40 minutes of non-face-to-face time during this encounter.  Location: patient- in a car, provider- home office   Norman Clay, MD     Psychiatric Initial Adult Assessment   Patient Identification: Faith Allen MRN:  [id number] Date of Evaluation:  01/27/2020 Referral Source: Doree Albee, MD Chief Complaint:   Chief Complaint    ADHD; Establish Care     Visit Diagnosis:    ICD-10-CM   1. Current moderate episode of major depressive disorder without prior episode (HCC)  F32.1 TSH  2. Inattention  R41.840     History of Present Illness:   Faith Allen is a 34 y.o. year old female with no known history of psychiatry disorder , who is referred for ADHD  She states that she has been suffering with fatigue and difficulty in concentration.  Although she did have issues with concentration through her whole life, she thinks it has worsened since last November. She could not keep up with responsibilities, such as paying bills, making priorities and housework. Her husband is angry at her for not being able to get things done as expected.  She feels hopeless, stating that It has been  difficult for her to keep her head above water. She feels sick of living this way, although she denies SI. Although she felt better when she made an appointment with her PCP in November, she felt hopeless afterwards as she could not get help she wanted to have from him. Although she tried to see mental health specialist in [place], she could not see the one due to insurance issues.  She looked up on the Internet, and states that she has symptoms which fit with ADHD.  She believes that her symptoms worsened due to having more kids, although she does not regret it. She reports good relationship with her children.   Depression- Although she describes herself as mentally strong and optimistic person, she has not been able to enjoy things anymore lately. She is not visiting her family anymore.  She feels hopeless, although she used to love life. She wears make up to look like a "normal person." She has initial insomnia.  She has good appetite.  She has significant fatigue, and feels it requires more energy to be with her children.  She denies SI.   ADHD symptoms- She reports racing thoughts/"mind goes millions of miles." She tends to procrastinate things. She is forgetful, and is unable to sustain task or do multi tasking. She used to do well at [employer]. She reports history of being late for work and missed in details, although she did well overall, referring that there was good structure at work. She has no known family history of  ADHD, although she wonders if her mother has some mental health issues, referring to her lack of social skill.   Substance- she occasionally drinks margarita or one glass of wine, twice a week. She denies drug use. She used to use marijuana; she reports worsening in concentration when she uses marijuana  Associated Signs/Symptoms: Depression Symptoms:  depressed mood, anhedonia, insomnia, fatigue, difficulty concentrating, hopelessness, anxiety, (Hypo) Manic Symptoms:   denies decreased need for sleep, euphoria Anxiety Symptoms:  mild anxiety Psychotic Symptoms:  denies AH, VH PTSD Symptoms: Negative  Past Psychiatric History:  Outpatient: denies Psychiatry admission: denies Previous suicide attempt: denies Past trials of medication: do not recollect. She reports she was on some medication for ADD when she was in 5th-8th grade History of violence: denies   Previous Psychotropic Medications: No   Substance Abuse History in the last 12 months:  No.  Consequences of Substance Abuse: NA  Past Medical History:  Past Medical History:  Diagnosis Date  . Attention deficit disorder   . Medical history non-contributory     Past Surgical History:  Procedure Laterality Date  . NO PAST SURGERIES    . TUBAL LIGATION Bilateral 10/07/2016   Procedure: POST PARTUM TUBAL LIGATION;  Surgeon: Florian Buff, MD;  Location: [facility];  Service: Gynecology;  Laterality: Bilateral;    Family Psychiatric History:  As below  Family History:  Family History  Problem Relation Age of Onset  . Diabetes Mother   . Asthma Mother   . Miscarriages / Stillbirths Sister   . Irritable bowel syndrome Sister   . Irritable bowel syndrome Sister   . Anxiety disorder Sister   . Inflammatory bowel disease Neg Hx   . Colon cancer Neg Hx     Social History:   Social History   Socioeconomic History  . Marital status: Married    Spouse name: Not on file  . Number of children: 4  . Years of education: Not on file  . Highest education level: Not on file  Occupational History  . 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 Smoking status: Former Smoker    Years: 1.00    Types: Cigarettes    Quit date: 07/05/2007    Years since quitting: 12.5  . Smokeless tobacco: Never Used  . Tobacco comment: Would smoke socially  Substance and Sexual Activity  . Alcohol use: Yes    Alcohol/week: 3.0 standard drinks    Types: 2 Glasses of wine, 1 Shots of liquor per week    Comment:  Once a month  . Drug use: No  . Sexual activity: Not Currently    Birth control/protection: Surgical    Comment: tubal  Other Topics Concern  . Not on file  Social History Narrative  . Not on file   Social Determinants of Health   Financial Resource Strain:   . Difficulty of Paying Living Expenses:   Food Insecurity:   . Worried About Charity fundraiser in the Last Year:   . Arboriculturist in the Last Year:   Transportation Needs:   . Film/video editor (Medical):   Marland Kitchen Lack of Transportation (Non-Medical):   Physical Activity:   . Days of Exercise per Week:   . Minutes of Exercise per Session:   Stress:   . Feeling of Stress :   Social Connections:   . Frequency of Communication with Friends and Family:   . Frequency of Social Gatherings with Friends and Family:   .  Attends Religious Services:   . Active Member of Clubs or Organizations:   . 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 She received certificate, She has GED; she stopped going to school at 12 th grade. Although she does not remember the reason, she states that she wanted to hang out with her friends instead.  She moved to [place] at age 67  Allergies:  No Known Allergies  Metabolic Disorder Labs: Lab Results  Component Value Date   HGBA1C 5.4 08/03/2019   MPG 108 08/03/2019   No results found for: PROLACTIN Lab Results  Component Value Date   CHOL 146 08/03/2019   TRIG 108 08/03/2019   HDL 46 (L) 08/03/2019   CHOLHDL 3.2 08/03/2019   [place] 80 08/03/2019   Lab Results  Component Value Date   TSH 1.54 08/03/2019     Therapeutic Level Labs: No results found for: LITHIUM No results found for: CBMZ No results found for: VALPROATE  Current Medications: Current Outpatient Medications  Medication Sig Dispense Refill  . buPROPion (WELLBUTRIN XL) 150 MG 24 hr tablet Take 1 tablet (150 mg total) by mouth daily. 30 tablet 1  . Emollient (ROC RETINOL CORREXION) CREA Apply topically.    Marland Kitchen ibuprofen (ADVIL) 800 MG tablet Take 1 tablet (800 mg total) by mouth 3 (three) times daily. 42 tablet 0  . mupirocin cream (BACTROBAN) 2 % Apply 1 application topically 2 (two) times daily. 15 g 0   No current facility-administered medications for this visit.    Musculoskeletal: Strength & Muscle Tone: N/A Gait & Station: N/A Patient leans: N/A  Psychiatric Specialty Exam: Review of Systems  currently breastfeeding.There is no height or weight on file to calculate BMI.  General Appearance: Fairly Groomed  Eye Contact:  Good  Speech:  Clear and Coherent  Volume:  Normal  Mood:  Depressed  Affect:  Appropriate, Congruent and euthymic  Thought Process:  Coherent  Orientation:  Full (Time, Place, and Person)  Thought Content:  Logical  Suicidal Thoughts:  No  Homicidal Thoughts:  No  Memory:  Immediate;   Good  Judgement:  Good  Insight:  Good  Psychomotor Activity:  Normal  Concentration:  Concentration: Good and Attention Span: Good  Recall:  Good  Fund of Knowledge:Good  Language: Good  Akathisia:  No  Handed:  Right  AIMS (if indicated):  not done  Assets:  Communication Skills Desire for Improvement  ADL's:  Intact  Cognition: WNL  Sleep:  Fair   Screenings: PHQ2-9     Office Visit from 08/03/2019 in [place]  PHQ-2 Total Score  2  PHQ-9 Total Score  14      Assessment and Plan:  ODETTE WOLFARTH is a 34 y.o. year old female with no known history of psychiatry disorder , who is referred for ADHD  1. Inattention 2. Current moderate episode of major depressive disorder without  prior episode [facility]) She reports significant worsening in inattention, and reports depressive symptoms since last November in the context of not being able to get help which she needed. Psychosocial stressors includes  taking care of her 4 children.   Although she does have features of ADHD, it has been difficult to discern whether these symptoms are attributable to depression and/or having underlying ADHD.  Will start bupropion to target depression and inattention.  Discussed potential risk of headache and worsening in anxiety.  She has no known history of seizure.  Will consider adding stimulant in the future if she has limited benefit from bupropion.   Plan 1. Start bupropion 150 mg daily  2. Check TSH  3. Next appointment- 7/7 at 8:40 for 30 mins, video   The patient demonstrates the following risk factors for suicide: Chronic risk factors for suicide include: psychiatric disorder of none. Acute risk factors for suicide include: unemployment. Protective factors for this patient include: responsibility to others (children, family) and hope for the future. Considering these factors, the overall suicide risk at this point appears to be low. Patient is appropriate for outpatient follow up.   Norman Clay, MD 5/26/20213:19 PM

## 2020-01-27 ENCOUNTER — Telehealth (INDEPENDENT_AMBULATORY_CARE_PROVIDER_SITE_OTHER): Payer: PRIVATE HEALTH INSURANCE | Admitting: Psychiatry

## 2020-01-27 ENCOUNTER — Encounter (HOSPITAL_COMMUNITY): Payer: Self-pay | Admitting: Psychiatry

## 2020-01-27 ENCOUNTER — Other Ambulatory Visit: Payer: Self-pay

## 2020-01-27 DIAGNOSIS — R41840 Attention and concentration deficit: Secondary | ICD-10-CM | POA: Diagnosis not present

## 2020-01-27 DIAGNOSIS — F321 Major depressive disorder, single episode, moderate: Secondary | ICD-10-CM | POA: Diagnosis not present

## 2020-01-27 MED ORDER — BUPROPION HCL ER (XL) 150 MG PO TB24: 150.0000 mg | ORAL_TABLET | Freq: Every day | ORAL | 1 refills | Status: DC

## 2020-02-25 ENCOUNTER — Telehealth (HOSPITAL_COMMUNITY): Payer: Self-pay | Admitting: Psychiatry

## 2020-03-01 NOTE — Progress Notes (Signed)
Virtual Visit via Video Note  I connected with Faith Allen on 03/09/20 at  8:40 AM EDT by a video enabled telemedicine application and verified that I am speaking with the correct person using two identifiers.   I discussed the limitations of evaluation and management by telemedicine and the availability of in person appointments. The patient expressed understanding and agreed to proceed.    I discussed the assessment and treatment plan with the patient. The patient was provided an opportunity to ask questions and all were answered. The patient agreed with the plan and demonstrated an understanding of the instructions.   The patient was advised to call back or seek an in-person evaluation if the symptoms worsen or if the condition fails to improve as anticipated.  Location: patient- outside of her father's home, provider- office   I provided 20 minutes of non-face-to-face time during this encounter.   Norman Clay, MD    [facility] MD/PA/NP OP Progress Note  03/09/2020 9:36 AM NYAJAH HYSON  MRN:  [id number]  Chief Complaint:  Chief Complaint    Depression; Follow-up     HPI:  This is a follow-up appointment for depression and inattention.  She states that she has been doing better. She believes bupropion helped her fatigue.  She does not feel tired as she used to.  She has been enjoying being with her family.  She has some daily routine that her children are at home.  They have been very helpful.  She makes sure to change clothes so that she can look "decent" so that she can give a better image in public. She finds it harder to take a shower as she prefers to have time for sitting in a couch for "mental resting" instead. Although she believes her energy is like other people (improved), she does not think she has enough energy/motivation to work on things such as regular exercise. She also complains of difficulty in concentration. Although she did try reading after  Along time, she could not  concentrate well and was "daydreaming." She continues to struggle with finishing a task, is easily distracted and procrastinating things. She denies insomnia. She has slightly decreased appetite. She denies SI. She denies anxiety. She has not noticed any side effect form bupropion. Although she was initially contemplating of uptitration of bupropion, she agreed to do so after discussion at length.   Daily routine: spends time with her husband and children in the morning. 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 She received certificate, She has GED; she stopped going to school at 12 th grade. Although she does not remember the reason, she states that she wanted to hang out with her friends instead.  She moved to [place] at age 86   Visit Diagnosis:    ICD-10-CM   1. Inattention  R41.840   2. Current moderate episode of major depressive disorder without prior episode ([place])  F32.1     Past Psychiatric History: Please see initial evaluation for full details. I have reviewed the history. No updates at this time.     Past Medical History:  Past Medical History:  Diagnosis Date   Attention deficit disorder  Medical history non-contributory     Past Surgical History:  Procedure Laterality Date   NO PAST SURGERIES     TUBAL LIGATION Bilateral 10/07/2016   Procedure: POST PARTUM TUBAL LIGATION;  Surgeon: Florian Buff, MD;  Location: [facility];  Service: Gynecology;  Laterality: Bilateral;    Family Psychiatric History: Please see initial evaluation for full details. I have reviewed the history. No updates at this time.     Family History:  Family History  Problem Relation Age of Onset    Diabetes Mother    Asthma Mother    Miscarriages / Stillbirths Sister    Irritable bowel syndrome Sister    Irritable bowel syndrome Sister    Anxiety disorder Sister    Inflammatory bowel disease Neg Hx    Colon cancer Neg Hx     Social History:  Social History   Socioeconomic History   Marital status: Married    Spouse name: Not on file   Number of children: 4   Years of education: Not on file   Highest education level: Not on file  Occupational History   Occupation: Stay-at-home  Tobacco Use   Smoking status: Former Smoker    Years: 1.00    Types: Cigarettes    Quit date: 07/05/2007    Years since quitting: 12.6   Smokeless tobacco: Never Used   Tobacco comment: Would smoke socially  Substance and Sexual Activity   Alcohol use: Yes    Alcohol/week: 3.0 standard drinks    Types: 2 Glasses of wine, 1 Shots of liquor per week    Comment: Once a month   Drug use: No   Sexual activity: Not Currently    Birth control/protection: Surgical    Comment: tubal  Other Topics Concern   Not on file  Social History Narrative   Not on file   Social Determinants of Health   Financial Resource Strain:    Difficulty of Paying Living Expenses:   Food Insecurity:    Worried About Charity fundraiser in the Last Year:    Arboriculturist in the Last Year:   Transportation Needs:    Film/video editor (Medical):    Lack of Transportation (Non-Medical):   Physical Activity:    Days of Exercise per Week:    Minutes of Exercise per Session:   Stress:    Feeling of Stress :   Social Connections:    Frequency of Communication with Friends and Family:    Frequency of Social Gatherings with Friends and Family:    Attends Religious Services:    Active Member of Clubs or Organizations:    Attends Archivist Meetings:    Marital Status:     Allergies: No Known Allergies  Metabolic Disorder Labs: Lab Results  Component Value Date    HGBA1C 5.4 08/03/2019   MPG 108 08/03/2019   No results found for: PROLACTIN Lab Results  Component Value Date   CHOL 146 08/03/2019   TRIG 108 08/03/2019   HDL 46 (L) 08/03/2019   CHOLHDL 3.2 08/03/2019   [place] 80 08/03/2019   Lab Results  Component Value Date   TSH 1.54 08/03/2019   TSH 0.483 03/03/2014    Therapeutic Level Labs: No results found for: LITHIUM No results found for: VALPROATE No components found for:  CBMZ  Current Medications: Current Outpatient Medications  Medication Sig Dispense Refill   buPROPion (WELLBUTRIN XL) 300 MG 24 hr tablet Take 1 tablet (300 mg  total) by mouth daily. 30 tablet 1   Emollient (ROC RETINOL CORREXION) CREA Apply topically.     ibuprofen (ADVIL) 800 MG tablet Take 1 tablet (800 mg total) by mouth 3 (three) times daily. 42 tablet 0   mupirocin cream (BACTROBAN) 2 % Apply 1 application topically 2 (two) times daily. 15 g 0   No current facility-administered medications for this visit.     Musculoskeletal: Strength & Muscle Tone: N/A Gait & Station: N/A Patient leans: N/A  Psychiatric Specialty Exam: Review of Systems  currently breastfeeding.There is no height or weight on file to calculate BMI.  General Appearance: Fairly Groomed  Eye Contact:  Good  Speech:  Clear and Coherent  Volume:  Normal  Mood:  better  Affect:  Appropriate, Congruent and less down  Thought Process:  Coherent  Orientation:  Full (Time, Place, and Person)  Thought Content: Logical   Suicidal Thoughts:  No  Homicidal Thoughts:  No  Memory:  Immediate;   Good  Judgement:  Good  Insight:  Good  Psychomotor Activity:  Normal  Concentration:  Concentration: Good and Attention Span: Good  Recall:  Good  Fund of Knowledge: Good  Language: Good  Akathisia:  No  Handed:  Right  AIMS (if indicated): not done  Assets:  Communication Skills Desire for Improvement  ADL's:  Intact  Cognition: WNL  Sleep:  Fair   Screenings: PHQ2-9      Office Visit from 08/03/2019 in [facility]  PHQ-2 Total Score 2  PHQ-9 Total Score 14       Assessment and Plan:  SYRAH DAUGHTREY is a 34 y.o. year old female with a history of depression, who presents for follow up appointment for below.   1. Inattention 2. Current moderate episode of major depressive disorder without prior episode ([place]) Although there has been significant improvement in fatigue since starting bupropion, she still has residual moos symptoms, which includes lack of motivation and difficulty in concentration.  Psychosocial stressors includes taking care of her 4 children.  Noted that it is difficult to discern whether her inattention is more attributable to depression and/or she has underlying ADHD.  We will do further up titration of bupropion to optimize treatment for depression/inattention.  She has no known history of seizure.  Will consider adding stimulant in the future if she has limited benefit from bupropion. She is advised again to obtain labs.   Plan 1. Increase bupropion 300 mg daily  2. Check TSH to rule out medical cause of her symptoms 3. Next appointment-  8/26 at 2:30 for 30 mins, video Father cell phone- [phone]  The patient demonstrates the following risk factors for suicide: Chronic risk factors for suicide include: psychiatric disorder of none. Acute risk factors for suicide include: unemployment. Protective factors for this patient include: responsibility to others (children, family) and hope for the future. Considering these factors, the overall suicide risk at this point appears to be low. Patient is appropriate for outpatient follow up.  Norman Clay, MD 03/09/2020, 9:36 AM

## 2020-03-09 ENCOUNTER — Other Ambulatory Visit: Payer: Self-pay

## 2020-03-09 ENCOUNTER — Telehealth (INDEPENDENT_AMBULATORY_CARE_PROVIDER_SITE_OTHER): Payer: PRIVATE HEALTH INSURANCE | Admitting: Psychiatry

## 2020-03-09 ENCOUNTER — Encounter (HOSPITAL_COMMUNITY): Payer: Self-pay | Admitting: Psychiatry

## 2020-03-09 DIAGNOSIS — F321 Major depressive disorder, single episode, moderate: Secondary | ICD-10-CM | POA: Diagnosis not present

## 2020-03-09 DIAGNOSIS — R41840 Attention and concentration deficit: Secondary | ICD-10-CM | POA: Diagnosis not present

## 2020-03-09 MED ORDER — BUPROPION HCL ER (XL) 300 MG PO TB24: 300.0000 mg | ORAL_TABLET | Freq: Every day | ORAL | 1 refills | Status: DC

## 2020-03-09 NOTE — Addendum Note (Signed)
Addended by: Norman Clay on: 03/09/2020 09:55 AM   Modules accepted: Orders

## 2020-03-09 NOTE — Patient Instructions (Addendum)
1. Increase bupropion 300 mg daily  2. Check lab/TSH 3. Next appointment-  8/26 at 2:30

## 2020-04-25 NOTE — Progress Notes (Deleted)
[place] MD/PA/NP OP Progress Note  04/25/2020 4:39 PM Faith Allen  MRN:  [id number]  Chief Complaint:  HPI: *** Visit Diagnosis: No diagnosis found.  Past Psychiatric History: Please see initial evaluation for full details. I have reviewed the history. No updates at this time.     Past Medical History:  Past Medical History:  Diagnosis Date  . Attention deficit disorder   . Medical history non-contributory     Past Surgical History:  Procedure Laterality Date  . NO PAST SURGERIES    . TUBAL LIGATION Bilateral 10/07/2016   Procedure: POST PARTUM TUBAL LIGATION;  Surgeon: Florian Buff, MD;  Location: [facility];  Service: Gynecology;  Laterality: Bilateral;    Family Psychiatric History: Please see initial evaluation for full details. I have reviewed the history. No updates at this time.     Family History:  Family History  Problem Relation Age of Onset  . Diabetes Mother   . Asthma Mother   . Miscarriages / Stillbirths Sister   . Irritable bowel syndrome Sister   . Irritable bowel syndrome Sister   . Anxiety disorder Sister   . Inflammatory bowel disease Neg Hx   . Colon cancer Neg Hx     Social History:  Social History   Socioeconomic History  . Marital status: Married    Spouse name: Not on file  . Number of children: 4  . Years of education: Not on file  . Highest education level: Not on file  Occupational History  . 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 Smoking status: Former Smoker    Years: 1.00    Types: Cigarettes    Quit date: 07/05/2007    Years since quitting: 12.8  . Smokeless tobacco: Never Used  . Tobacco comment: Would smoke socially  Substance and Sexual Activity  . Alcohol use: Yes    Alcohol/week: 3.0 standard drinks    Types: 2 Glasses of wine, 1 Shots of liquor per week    Comment: Once a month  . Drug use: No  . Sexual activity: Not Currently    Birth control/protection: Surgical    Comment: tubal  Other Topics Concern  . Not on file   Social History Narrative  . Not on file   Social Determinants of Health   Financial Resource Strain:   . Difficulty of Paying Living Expenses: Not on file  Food Insecurity:   . Worried About Charity fundraiser in the Last Year: Not on file  . Ran Out of Food in the Last Year: Not on file  Transportation Needs:   . Lack of Transportation (Medical): Not on file  . Lack of Transportation (Non-Medical): Not on file  Physical Activity:   . Days of Exercise per Week: Not on file  . Minutes of Exercise per Session: Not on file  Stress:   . Feeling of Stress : Not on file  Social Connections:   . Frequency of Communication with Friends and Family: Not on file  . Frequency of Social Gatherings with Friends and Family: Not on file  . Attends Religious Services: Not on file  . Active Member of Clubs or Organizations: Not on file  . Attends Archivist Meetings: Not on file  . Marital Status: Not on file    Allergies: No Known Allergies  Metabolic Disorder Labs: Lab Results  Component Value Date   HGBA1C 5.4 08/03/2019   MPG 108 08/03/2019   No results found for: PROLACTIN Lab  Results  Component Value Date   CHOL 146 08/03/2019   TRIG 108 08/03/2019   HDL 46 (L) 08/03/2019   CHOLHDL 3.2 08/03/2019   Terminous 80 08/03/2019   Lab Results  Component Value Date   TSH 1.54 08/03/2019   TSH 0.483 03/03/2014    Therapeutic Level Labs: No results found for: LITHIUM No results found for: VALPROATE No components found for:  CBMZ  Current Medications: Current Outpatient Medications  Medication Sig Dispense Refill  . buPROPion (WELLBUTRIN XL) 300 MG 24 hr tablet Take 1 tablet (300 mg total) by mouth daily. 30 tablet 1  . Emollient (ROC RETINOL CORREXION) CREA Apply topically.    Marland Kitchen ibuprofen (ADVIL) 800 MG tablet Take 1 tablet (800 mg total) by mouth 3 (three) times daily. 42 tablet 0  . mupirocin cream (BACTROBAN) 2 % Apply 1 application topically 2 (two) times daily.  15 g 0   No current facility-administered medications for this visit.     Musculoskeletal: Strength & Muscle Tone: N/A Gait & Station: N/A Patient leans: N/A  Psychiatric Specialty Exam: Review of Systems  currently breastfeeding.There is no height or weight on file to calculate BMI.  General Appearance: {Appearance:22683}  Eye Contact:  {BHH EYE CONTACT:22684}  Speech:  Clear and Coherent  Volume:  Normal  Mood:  {BHH MOOD:22306}  Affect:  {Affect (PAA):22687}  Thought Process:  Coherent  Orientation:  Full (Time, Place, and Person)  Thought Content: Logical   Suicidal Thoughts:  {ST/HT (PAA):22692}  Homicidal Thoughts:  {ST/HT (PAA):22692}  Memory:  Immediate;   Good  Judgement:  {Judgement (PAA):22694}  Insight:  {Insight (PAA):22695}  Psychomotor Activity:  Normal  Concentration:  Concentration: Good and Attention Span: Good  Recall:  Good  Fund of Knowledge: Good  Language: Good  Akathisia:  No  Handed:  Right  AIMS (if indicated): not done  Assets:  Communication Skills Desire for Improvement  ADL's:  Intact  Cognition: WNL  Sleep:  {BHH GOOD/FAIR/POOR:22877}   Screenings: PHQ2-9     Office Visit from 08/03/2019 in [facility]  PHQ-2 Total Score 2  PHQ-9 Total Score 14       Assessment and Plan:  Faith Allen is a 34 y.o. year old female with a history of depression, who presents for follow up appointment for below.   1. Inattention 2. Current moderate episode of major depressive disorder without prior episode ([place]) Although there has been significant improvement in fatigue since starting bupropion, she still has residual moos symptoms, which includes lack of motivation and difficulty in concentration.  Psychosocial stressors includes taking care of her 4 children.  Noted that it is difficult to discern whether her inattention is more attributable to depression and/or she has underlying ADHD.  We will do further up titration of bupropion to  optimize treatment for depression/inattention.  She has no known history of seizure.  Will consider adding stimulant in the future if she has limited benefit from bupropion. She is advised again to obtain labs.   Plan 1. Increase bupropion 300 mg daily  2. Check TSH to rule out medical cause of her symptoms 3. Next appointment-  8/26 at 2:30 for 30 mins, video Father cell phone- [phone]  The patient demonstrates the following risk factors for suicide: Chronic risk factors for suicide include:psychiatric disorder ofnone. Acute risk factorsfor suicide include: unemployment. Protective factorsfor this patient include: responsibility to others (children, family) and hope for the future. Considering these factors, the overall suicide risk at  this point appears to below. Patientisappropriate for outpatient follow up.   Norman Clay, MD 04/25/2020, 4:39 PM

## 2020-04-28 ENCOUNTER — Telehealth (HOSPITAL_COMMUNITY): Payer: Self-pay | Admitting: Psychiatry

## 2020-04-28 ENCOUNTER — Other Ambulatory Visit: Payer: Self-pay

## 2020-04-28 ENCOUNTER — Telehealth (HOSPITAL_COMMUNITY): Payer: PRIVATE HEALTH INSURANCE | Admitting: Psychiatry

## 2020-04-28 NOTE — Telephone Encounter (Signed)
Sent link for video visit through Epic. Patient did not sign in. Called the patient  for appointment scheduled today. The patient did not answer the phone. Left voice message to contact the office.  

## 2020-07-27 ENCOUNTER — Other Ambulatory Visit: Payer: Self-pay

## 2020-07-27 ENCOUNTER — Encounter: Payer: Self-pay | Admitting: Psychiatry

## 2020-07-27 ENCOUNTER — Telehealth (INDEPENDENT_AMBULATORY_CARE_PROVIDER_SITE_OTHER): Payer: PRIVATE HEALTH INSURANCE | Admitting: Psychiatry

## 2020-07-27 DIAGNOSIS — R41840 Attention and concentration deficit: Secondary | ICD-10-CM

## 2020-07-27 DIAGNOSIS — F321 Major depressive disorder, single episode, moderate: Secondary | ICD-10-CM | POA: Diagnosis not present

## 2020-07-27 MED ORDER — BUPROPION HCL ER (XL) 150 MG PO TB24: 150.0000 mg | ORAL_TABLET | Freq: Every day | ORAL | 0 refills | Status: DC

## 2020-07-27 NOTE — Patient Instructions (Signed)
1. Decrease bupropion 150 mg daily 2. Check TSH, obtain urine drug test  3. Plan to start Concerta 18 mg daily after obtaining test 4. Next appointment- 12/23 at 9 AM

## 2020-07-27 NOTE — Addendum Note (Signed)
Addended by: Norman Clay on: 07/27/2020 11:28 AM   Modules accepted: Orders

## 2020-07-27 NOTE — Progress Notes (Signed)
Virtual Visit via Video Note  I connected with Faith Allen on 07/27/20 at  9:00 AM EST by a video enabled telemedicine application and verified that I am speaking with the correct person using two identifiers.  Location: Patient: home Provider: office Persons participated in the visit- patient, provider   I discussed the limitations of evaluation and management by telemedicine and the availability of in person appointments. The patient expressed understanding and agreed to proceed.     I discussed the assessment and treatment plan with the patient. The patient was provided an opportunity to ask questions and all were answered. The patient agreed with the plan and demonstrated an understanding of the instructions.   The patient was advised to call back or seek an in-person evaluation if the symptoms worsen or if the condition fails to improve as anticipated.  I provided 25 minutes of non-face-to-face time during this encounter.   Norman Clay, MD    [facility] MD/PA/NP OP Progress Note  07/27/2020 9:41 AM Faith Allen  MRN:  [id number]  Chief Complaint:  Chief Complaint    Follow-up; Depression; Other     HPI:  This is a follow-up appointment for depression and inattention.  She states that she is not doing good.  Although she has started a new job, it causes more issues in time management.  She enjoys the job itself.  She has always been in taking care of her children.  She also finds it easier for her as she does not need to concentration at work.  She tends to feel down as she feels like a constant failure of management, although she is optimistic and she does the best she can.  She is forgetful, has not been able to complete tasks, or do multitasking.  She cannot get things done, and does not handle things well.  She had worsening in mood last week due to these symptoms, and has decided to have this appointment again.  She has occasional insomnia.  She has fair energy.  She has mild  anhedonia.  She denies change in weight or appetite.  She denies SI.  She denies anxiety.   Alcohol- one margarita two days ago, substance- used marijuana (worsening in concentration), in 2016. However at the end of the interview, she shares that she used some substance, which was legalized in [place] two months ago after she was informed that she will have urine drug test.   Daily routine: spends time with her husband and children in the morning. She goes to park or visits her father until her husband return from work Employment:unemployed , used to work at daycare until 2017 for 3 years, quit to take care of her son Household:husband and four children Marital status:married since 52 Number of children:4, age 26, 84, 68, 44 Education:one year college ofEarly childhood education. Shereceived certificate,She hasGED; she stopped going to school at12 th grade. Although she does not remember the reason, she states that she wanted to hang out with her friends instead.  She moved to [place] at age 13   Visit Diagnosis:    ICD-10-CM   1. Inattention  R41.840 DRUG MONITOR, PANEL 1, W/CONF, URINE    TSH  2. Current moderate episode of major depressive disorder without prior episode ([place])  F32.1     Past Psychiatric History: Please see initial evaluation for full details. I have reviewed the history. No updates at this time.     Past Medical History:  Past Medical History:  Diagnosis Date  . Attention deficit disorder   . Medical history non-contributory     Past Surgical History:  Procedure Laterality Date  . NO PAST SURGERIES    . TUBAL LIGATION Bilateral 10/07/2016   Procedure: POST PARTUM TUBAL LIGATION;  Surgeon: Florian Buff, MD;  Location: [facility];  Service: Gynecology;  Laterality: Bilateral;    Family Psychiatric History: Please see initial evaluation for full details. I have reviewed the history. No updates at this time.     Family History:  Family History  Problem  Relation Age of Onset  . Diabetes Mother   . Asthma Mother   . Miscarriages / Stillbirths Sister   . Irritable bowel syndrome Sister   . Irritable bowel syndrome Sister   . Anxiety disorder Sister   . Inflammatory bowel disease Neg Hx   . Colon cancer Neg Hx     Social History:  Social History   Socioeconomic History  . Marital status: Married    Spouse name: Not on file  . Number of children: 4  . Years of education: Not on file  . Highest education level: Not on file  Occupational History  . 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 Smoking status: Former Smoker    Years: 1.00    Types: Cigarettes    Quit date: 07/05/2007    Years since quitting: 13.0  . Smokeless tobacco: Never Used  . Tobacco comment: Would smoke socially  Substance and Sexual Activity  . Alcohol use: Yes    Alcohol/week: 3.0 standard drinks    Types: 2 Glasses of wine, 1 Shots of liquor per week    Comment: Once a month  . Drug use: No  . Sexual activity: Not Currently    Birth control/protection: Surgical    Comment: tubal  Other Topics Concern  . Not on file  Social History Narrative  . Not on file   Social Determinants of Health   Financial Resource Strain:   . Difficulty of Paying Living Expenses: Not on file  Food Insecurity:   . Worried About Charity fundraiser in the Last Year: Not on file  . Ran Out of Food in the Last Year: Not on file  Transportation Needs:   . Lack of Transportation (Medical): Not on file  . Lack of Transportation (Non-Medical): Not on file  Physical Activity:   . Days of Exercise per Week: Not on file  . Minutes of Exercise per Session: Not on file  Stress:   . Feeling of Stress : Not on file  Social Connections:   . Frequency of Communication with Friends and Family: Not on file  . Frequency of Social Gatherings with Friends and Family: Not on file  . Attends Religious Services: Not on file  . Active Member of Clubs or Organizations: Not on file  .  Attends Archivist Meetings: Not on file  . Marital Status: Not on file    Allergies: No Known Allergies  Metabolic Disorder Labs: Lab Results  Component Value Date   HGBA1C 5.4 08/03/2019   MPG 108 08/03/2019   No results found for: PROLACTIN Lab Results  Component Value Date   CHOL 146 08/03/2019   TRIG 108 08/03/2019   HDL 46 (L) 08/03/2019   CHOLHDL 3.2 08/03/2019   [place] 80 08/03/2019   Lab Results  Component Value Date   TSH 1.54 08/03/2019   TSH 0.483 03/03/2014    Therapeutic Level Labs: No results found for:  LITHIUM No results found for: VALPROATE No components found for:  CBMZ  Current Medications: Current Outpatient Medications  Medication Sig Dispense Refill  . buPROPion (WELLBUTRIN XL) 150 MG 24 hr tablet Take 1 tablet (150 mg total) by mouth daily. 30 tablet 0  . Emollient (ROC RETINOL CORREXION) CREA Apply topically.    Marland Kitchen ibuprofen (ADVIL) 800 MG tablet Take 1 tablet (800 mg total) by mouth 3 (three) times daily. 42 tablet 0  . mupirocin cream (BACTROBAN) 2 % Apply 1 application topically 2 (two) times daily. 15 g 0   No current facility-administered medications for this visit.     Musculoskeletal: Strength & Muscle Tone: N/A Gait & Station: N/A Patient leans: N/A  Psychiatric Specialty Exam: Review of Systems  Psychiatric/Behavioral: Positive for decreased concentration, dysphoric mood and sleep disturbance. Negative for agitation, behavioral problems, confusion, hallucinations, self-injury and suicidal ideas. The patient is not nervous/anxious and is not hyperactive.   All other systems reviewed and are negative.   currently breastfeeding.There is no height or weight on file to calculate BMI.  General Appearance: Fairly Groomed  Eye Contact:  Good  Speech:  Clear and Coherent  Volume:  Normal  Mood:  down  Affect:  Appropriate, Congruent and down  Thought Process:  Coherent and Goal Directed  Orientation:  Full (Time, Place,  and Person)  Thought Content: Logical   Suicidal Thoughts:  No  Homicidal Thoughts:  No  Memory:  Immediate;   Good  Judgement:  Good  Insight:  Good  Psychomotor Activity:  Normal  Concentration:  Concentration: Good and Attention Span: Good  Recall:  Good  Fund of Knowledge: Good  Language: Good  Akathisia:  No  Handed:  Right  AIMS (if indicated): not done  Assets:  Communication Skills Desire for Improvement  ADL's:  Intact  Cognition: WNL  Sleep:  Fair   Screenings: PHQ2-9     Office Visit from 08/03/2019 in [facility]  PHQ-2 Total Score 2  PHQ-9 Total Score 14       Assessment and Plan:  Faith Allen is a 34 y.o. year old female with a history of depression, who presents for follow up appointment for below.   1. Inattention She continues to report symptoms of ADHD as described above.  Will plan to start Concerta to target ADHD after obtaining labs.  Noted that although she initially denied any marijuana use for the past 5 years, she later states that she used some substance in [place], where it is legalized 2 months ago after she was informed to obtain urine drug test.  She verbalized her understanding that stimulant will not be prescribed if any signs of misuse or substance use.  Discussed potential risk of palpitation, worsening in anxiety, insomnia.   2. 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  She had limited benefit from higher dose of bupropion.  Will continue lower dose of bupropion to target depression.  She has no known history of seizure.   Plan 1. Decrease bupropion 150 mg daily 2. Check TSH, obtain urine drug test  3. Start Concerta 18 mg daily after obtaining test 3. Next appointment- 12/23 at 9 AM for 30 mins, video Father cell phone- [phone], [email or web address] - discussed attendance policy  The patient  demonstrates the following risk factors for suicide: Chronic risk factors for suicide include:psychiatric disorder ofnone. Acute risk factorsfor suicide include: unemployment. Protective factorsfor  this patient include: responsibility to others (children, family) and hope for the future. Considering these factors, the overall suicide risk at this point appears to below. Patientisappropriate for outpatient follow up.         Norman Clay, MD 07/27/2020, 9:41 AM

## 2020-07-28 LAB — DRUG MONITOR, PANEL 1, W/CONF, URINE
Amphetamines: NEGATIVE ng/mL (ref <500)
Barbiturates: NEGATIVE ng/mL (ref <300)
Benzodiazepines: NEGATIVE ng/mL (ref <100)
Cocaine Metabolite: NEGATIVE ng/mL (ref <150)
Creatinine: 207.8 mg/dL
Marijuana Metabolite: NEGATIVE ng/mL (ref <20)
Methadone Metabolite: NEGATIVE ng/mL (ref <100)
Opiates: NEGATIVE ng/mL (ref <100)
Oxidant: NEGATIVE ug/mL
Oxycodone: NEGATIVE ng/mL (ref <100)
Phencyclidine: NEGATIVE ng/mL (ref <25)
pH: 7.2 (ref 4.5–9.0)

## 2020-07-28 LAB — TSH: TSH: 0.89 mIU/L

## 2020-07-28 LAB — DM TEMPLATE

## 2020-08-01 ENCOUNTER — Other Ambulatory Visit: Payer: Self-pay | Admitting: Psychiatry

## 2020-08-01 MED ORDER — METHYLPHENIDATE HCL ER (OSM) 18 MG PO TBCR: 18.0000 mg | EXTENDED_RELEASE_TABLET | Freq: Every day | ORAL | 0 refills | Status: DC

## 2020-08-01 NOTE — Progress Notes (Signed)
Blood test, urine test was within normal range. Could you update the patient that Concerta 18 mg daily was ordered? Thanks

## 2020-08-10 ENCOUNTER — Encounter (INDEPENDENT_AMBULATORY_CARE_PROVIDER_SITE_OTHER): Payer: No Typology Code available for payment source | Admitting: Nurse Practitioner

## 2020-08-15 ENCOUNTER — Telehealth (INDEPENDENT_AMBULATORY_CARE_PROVIDER_SITE_OTHER): Payer: PRIVATE HEALTH INSURANCE | Admitting: Internal Medicine

## 2020-08-15 ENCOUNTER — Telehealth (INDEPENDENT_AMBULATORY_CARE_PROVIDER_SITE_OTHER): Payer: Self-pay | Admitting: Internal Medicine

## 2020-08-15 ENCOUNTER — Other Ambulatory Visit: Payer: No Typology Code available for payment source

## 2020-08-15 ENCOUNTER — Encounter (INDEPENDENT_AMBULATORY_CARE_PROVIDER_SITE_OTHER): Payer: Self-pay | Admitting: Internal Medicine

## 2020-08-15 ENCOUNTER — Other Ambulatory Visit (INDEPENDENT_AMBULATORY_CARE_PROVIDER_SITE_OTHER): Payer: Self-pay | Admitting: Internal Medicine

## 2020-08-15 DIAGNOSIS — Z20822 Contact with and (suspected) exposure to covid-19: Secondary | ICD-10-CM

## 2020-08-15 DIAGNOSIS — J329 Chronic sinusitis, unspecified: Secondary | ICD-10-CM

## 2020-08-15 DIAGNOSIS — R0981 Nasal congestion: Secondary | ICD-10-CM | POA: Diagnosis not present

## 2020-08-15 DIAGNOSIS — R519 Headache, unspecified: Secondary | ICD-10-CM | POA: Diagnosis not present

## 2020-08-15 MED ORDER — PREDNISONE 20 MG PO TABS: 40.0000 mg | ORAL_TABLET | Freq: Every day | ORAL | 0 refills | Status: DC

## 2020-08-15 MED ORDER — AMOXICILLIN-POT CLAVULANATE 875-125 MG PO TABS: 1.0000 | ORAL_TABLET | Freq: Two times a day (BID) | ORAL | 0 refills | Status: DC

## 2020-08-15 NOTE — Progress Notes (Signed)
Metrics: Intervention Frequency ACO  Documented Smoking Status Yearly  Screened one or more times in 24 months  Cessation Counseling or  Active cessation medication Past 24 months  Past 24 months   Guideline developer: UpToDate (See UpToDate for funding source) Date Released: 2014       Wellness Office Visit  Subjective:  [name], female    DOB: 03/21/1986  Age: 34 y.o. MRN: [id number]  CC: This is an audio telemedicine visit with the permission of the patient who is at home and I am in my office. I used 2 identifiers to identify the patient. Head/sinus congestion and postnasal drainage. HPI  The patient started with headaches and low-grade fevers about a week ago and now appears to have developed thick green postnasal drainage combined with cough productive of green thick mucus.  She denies any dyspnea, body aches.  She has not been vaccinated with COVID-19 vaccine. Past Medical History:  Diagnosis Date  . Attention deficit disorder   . Medical history non-contributory    Past Surgical History:  Procedure Laterality Date  . NO PAST SURGERIES    . TUBAL LIGATION Bilateral 10/07/2016   Procedure: POST PARTUM TUBAL LIGATION;  Surgeon: Florian Buff, MD;  Location: [facility];  Service: Gynecology;  Laterality: Bilateral;     Family History  Problem Relation Age of Onset  . Diabetes Mother   . Asthma Mother   . Miscarriages / Stillbirths Sister   . Irritable bowel syndrome Sister   . Irritable bowel syndrome Sister   . Anxiety disorder Sister   . Inflammatory bowel disease Neg Hx   . Colon cancer Neg Hx     Social History   Social History Narrative  . Not on file   Social History   Tobacco Use  . Smoking status: Former Smoker    Years: 1.00    Types: Cigarettes    Quit date: 07/05/2007    Years since quitting: 13.1  . Smokeless tobacco: Never Used  . Tobacco comment: Would smoke socially  Substance Use Topics  . Alcohol use: Yes    Alcohol/week:  3.0 standard drinks    Types: 2 Glasses of wine, 1 Shots of liquor per week    Comment: Once a month    Current Meds  Medication Sig  . buPROPion (WELLBUTRIN XL) 150 MG 24 hr tablet Take 1 tablet (150 mg total) by mouth daily.  . Emollient (ROC RETINOL CORREXION) CREA Apply topically.  Marland Kitchen ibuprofen (ADVIL) 800 MG tablet Take 1 tablet (800 mg total) by mouth 3 (three) times daily.  . methylphenidate (CONCERTA) 18 MG PO CR tablet Take 1 tablet (18 mg total) by mouth daily.  . mupirocin cream (BACTROBAN) 2 % Apply 1 application topically 2 (two) times daily.      Depression screen PHQ 2/9 08/03/2019  Decreased Interest 1  Down, Depressed, Hopeless 1  PHQ - 2 Score 2  Altered sleeping 2  Tired, decreased energy 3  Change in appetite 3  Feeling bad or failure about yourself  1  Trouble concentrating 3  Moving slowly or fidgety/restless 0  Suicidal thoughts 0  PHQ-9 Score 14  Difficult doing work/chores Somewhat difficult     Objective:   Today's Vitals: There were no vitals taken for this visit. Vitals with BMI 08/15/2020 10/21/2019 08/11/2019  Height (No Data) - (No Data)  Weight (No Data) - (No Data)  BMI - - -  Systolic (No Data) 570 (No Data)  Diastolic (No Data) 73 (No Data)  Pulse - 73 -     Physical Exam   She appears to be alert and orientated on the phone.  She does not appear to be dyspneic on the phone.    Assessment   1. Sinus congestion   2. Acute nonintractable headache, unspecified headache type   3. Sinusitis, unspecified chronicity, unspecified location       Tests ordered No orders of the defined types were placed in this encounter.    Plan: 1. I think she probably does have a sinus infection and I have sent antibiotics and steroids for 5 days.  I explained possible side effects of the prednisone. 2.I have highly recommended that she get COVID-19 test prior to taking any medicines and if it is positive, she will let us know. 3.I have also  told her that she has had several cancellations and no-shows of appointments and if this continues, we will need to dismiss her from the practice.  I have arranged an appointment with Judson Roch in a couple of months and hopefully she will keep this appointment. This phone call lasted 8 minutes and 57 seconds.  Meds ordered this encounter  Medications  . amoxicillin-clavulanate (AUGMENTIN) 875-125 MG tablet    Sig: Take 1 tablet by mouth 2 (two) times daily.    Dispense:  20 tablet    Refill:  0  . predniSONE (DELTASONE) 20 MG tablet    Sig: Take 2 tablets (40 mg total) by mouth daily with breakfast.    Dispense:  10 tablet    Refill:  0    Whitman Meinhardt Luther Parody, MD

## 2020-08-16 ENCOUNTER — Encounter (INDEPENDENT_AMBULATORY_CARE_PROVIDER_SITE_OTHER): Payer: Self-pay

## 2020-08-16 LAB — SARS-COV-2, NAA 2 DAY TAT

## 2020-08-16 LAB — NOVEL CORONAVIRUS, NAA: SARS-CoV-2, NAA: NOT DETECTED

## 2020-08-16 NOTE — Telephone Encounter (Signed)
Done

## 2020-08-18 NOTE — Progress Notes (Signed)
Virtual Visit via Telephone Note  I connected with Faith Allen on 08/25/20 at  9:00 AM EST by telephone and verified that I am speaking with the correct person using two identifiers.  Location: Patient: home Provider: office Persons participated in the visit- patient, provider   I discussed the limitations, risks, security and privacy concerns of performing an evaluation and management service by telephone and the availability of in person appointments. I also discussed with the patient that there may be a patient responsible charge related to this service. The patient expressed understanding and agreed to proceed.      I discussed the assessment and treatment plan with the patient. The patient was provided an opportunity to ask questions and all were answered. The patient agreed with the plan and demonstrated an understanding of the instructions.   The patient was advised to call back or seek an in-person evaluation if the symptoms worsen or if the condition fails to improve as anticipated.  I provided 24 minutes of non-face-to-face time during this encounter.   Norman Clay, MD    [facility] MD/PA/NP OP Progress Note  08/25/2020 9:22 AM Faith Allen  MRN:  [id number]  Chief Complaint:  Chief Complaint    Follow-up; Depression; ADHD     HPI:  This is a follow-up appointment for depression and ADHD.  She states that she does not feel tired since starting Concerta.  She feels less frustrated as she has more energy. She does not need to drink coffee in the morning anymore. She denies any issues with focus at work, stating that things are natural to her as she has done that work for many years.  She is not as harsh to her children compared to before.  However, she still has difficulty in concentration, completing tasks.  She is also forgetful, and takes time to articulate what she thinks.  She denies insomnia.  She reports decrease in appetite.  She denies SI.  She denies alcohol use or  drug use.  She takes bupropion every other day as she occasionally forgets to take this medication.  She agrees to take it more regularly.  Although she did have headache when she was first started on Concerta, she denies any side effect except appetite loss at this time.   Daily routine:spends time with her husbandandchildrenin the morning. She goesto park orvisits her fatheruntil her husband return from work Employment:works at [employer] and four children Marital status:married since 1990 Number of children:4, age 32, 32, 81, 36 Education:one year college ofEarly childhood education. Shereceived certificate,She hasGED; she stopped going to school at12 th grade. Although she does not remember the reason, she states that she wanted to hang out with her friends instead.  She moved to [place] at age 38  Visit Diagnosis: No diagnosis found.  Past Psychiatric History: Please see initial evaluation for full details. I have reviewed the history. No updates at this time.     Past Medical History:  Past Medical History:  Diagnosis Date  . Attention deficit disorder   . Medical history non-contributory     Past Surgical History:  Procedure Laterality Date  . NO PAST SURGERIES    . TUBAL LIGATION Bilateral 10/07/2016   Procedure: POST PARTUM TUBAL LIGATION;  Surgeon: Florian Buff, MD;  Location: [facility];  Service: Gynecology;  Laterality: Bilateral;    Family Psychiatric History: Please see initial evaluation for full details. I have reviewed the history. No updates at this time.  Family History:  Family History  Problem Relation Age of Onset  . Diabetes Mother   . Asthma Mother   . Miscarriages / Stillbirths Sister   . Irritable bowel syndrome Sister   . Irritable bowel syndrome Sister   . Anxiety disorder Sister   . Inflammatory bowel disease Neg Hx   . Colon cancer Neg Hx     Social History:  Social History   Socioeconomic History  . Marital  status: Married    Spouse name: Not on file  . Number of children: 4  . Years of education: Not on file  . Highest education level: Not on file  Occupational History  . 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 Smoking status: Former Smoker    Years: 1.00    Types: Cigarettes    Quit date: 07/05/2007    Years since quitting: 13.1  . Smokeless tobacco: Never Used  . Tobacco comment: Would smoke socially  Substance and Sexual Activity  . Alcohol use: Yes    Alcohol/week: 3.0 standard drinks    Types: 2 Glasses of wine, 1 Shots of liquor per week    Comment: Once a month  . Drug use: No  . Sexual activity: Not Currently    Birth control/protection: Surgical    Comment: tubal  Other Topics Concern  . Not on file  Social History Narrative  . Not on file   Social Determinants of Health   Financial Resource Strain: Not on file  Food Insecurity: Not on file  Transportation Needs: Not on file  Physical Activity: Not on file  Stress: Not on file  Social Connections: Not on file    Allergies: No Known Allergies  Metabolic Disorder Labs: Lab Results  Component Value Date   HGBA1C 5.4 08/03/2019   MPG 108 08/03/2019   No results found for: PROLACTIN Lab Results  Component Value Date   CHOL 146 08/03/2019   TRIG 108 08/03/2019   HDL 46 (L) 08/03/2019   CHOLHDL 3.2 08/03/2019   [place] 80 08/03/2019   Lab Results  Component Value Date   TSH 0.89 07/27/2020   TSH 1.54 08/03/2019    Therapeutic Level Labs: No results found for: LITHIUM No results found for: VALPROATE No components found for:  CBMZ  Current Medications: Current Outpatient Medications  Medication Sig Dispense Refill  . amoxicillin-clavulanate (AUGMENTIN) 875-125 MG tablet Take 1 tablet by mouth 2 (two) times daily. 20 tablet 0  . buPROPion (WELLBUTRIN XL) 150 MG 24 hr tablet Take 1 tablet (150 mg total) by mouth daily. 30 tablet 0  . Emollient (ROC RETINOL CORREXION) CREA Apply topically.    Marland Kitchen  ibuprofen (ADVIL) 800 MG tablet Take 1 tablet (800 mg total) by mouth 3 (three) times daily. 42 tablet 0  . methylphenidate (CONCERTA) 18 MG PO CR tablet Take 1 tablet (18 mg total) by mouth daily. 30 tablet 0  . mupirocin cream (BACTROBAN) 2 % Apply 1 application topically 2 (two) times daily. 15 g 0  . predniSONE (DELTASONE) 20 MG tablet Take 2 tablets (40 mg total) by mouth daily with breakfast. 10 tablet 0   No current facility-administered medications for this visit.     Musculoskeletal: Strength & Muscle Tone: N/A Gait & Station: N/A Patient leans: N/A  Psychiatric Specialty Exam: Review of Systems  Psychiatric/Behavioral: Positive for decreased concentration. Negative for agitation, behavioral problems, confusion, dysphoric mood, hallucinations, self-injury, sleep disturbance and suicidal ideas. The patient is not nervous/anxious and is not hyperactive.  All other systems reviewed and are negative.   currently breastfeeding.There is no height or weight on file to calculate BMI.  General Appearance: NA  Eye Contact:  NA  Speech:  Clear and Coherent  Volume:  Normal  Mood:  good  Affect:  NA  Thought Process:  Coherent  Orientation:  Full (Time, Place, and Person)  Thought Content: Logical   Suicidal Thoughts:  No  Homicidal Thoughts:  No  Memory:  Immediate;   Good  Judgement:  Good  Insight:  Good  Psychomotor Activity:  Normal  Concentration:  Concentration: Good and Attention Span: Good  Recall:  Good  Fund of Knowledge: Good  Language: Good  Akathisia:  No  Handed:  Right  AIMS (if indicated): not done  Assets:  Communication Skills Desire for Improvement  ADL's:  Intact  Cognition: WNL  Sleep:  Good   Screenings: PHQ2-9   [place] Office Visit from 08/03/2019 in [place]  PHQ-2 Total Score 2  PHQ-9 Total Score 14       Assessment and Plan:  Faith Allen is a 34 y.o. year old female with a history of depression, who presents for  follow up appointment for below.   1. Inattention She reports marginal improvement in her inattention since starting Concerta.  Will uptitrate the dose to optimize the effect for inattention.  Discussed potential risks, which includes but not limited to palpitation, worsening in and anxiety, headache, and insomnia.   2. Current mild episode of major depressive disorder without prior episode ([place]) There has been more improvement in depressive symptoms especially fatigue since the last visit.  Will continue current dose of bupropion to target depression.  She agrees to try to take this medication regularly unless it causes any side effect.  She has no known history of seizure.   Plan 1. Continuebupropion150 mg daily 2. Increase Concerta 27 mg daily - monitor headache, appetite loss 3. Next appointment- 1/19 at 1 PM for 30 mins, video Father cell [phone], [email or web address] - discussed attendance policy  The patient demonstrates the following risk factors for suicide: Chronic risk factors for suicide include:psychiatric disorder ofnone. Acute risk factorsfor suicide include: unemployment. Protective factorsfor this patient include: responsibility to others (children, family) and hope for the future. Considering these factors, the overall suicide risk at this point appears to below. Patientisappropriate for outpatient follow up.  Norman Clay, MD 08/25/2020, 9:22 AM

## 2020-08-25 ENCOUNTER — Other Ambulatory Visit: Payer: Self-pay

## 2020-08-25 ENCOUNTER — Telehealth (INDEPENDENT_AMBULATORY_CARE_PROVIDER_SITE_OTHER): Payer: PRIVATE HEALTH INSURANCE | Admitting: Psychiatry

## 2020-08-25 ENCOUNTER — Encounter: Payer: Self-pay | Admitting: Psychiatry

## 2020-08-25 DIAGNOSIS — R41840 Attention and concentration deficit: Secondary | ICD-10-CM

## 2020-08-25 DIAGNOSIS — F320 Major depressive disorder, single episode, mild: Secondary | ICD-10-CM | POA: Diagnosis not present

## 2020-08-25 MED ORDER — METHYLPHENIDATE HCL ER (OSM) 27 MG PO TBCR: 27.0000 mg | EXTENDED_RELEASE_TABLET | Freq: Every day | ORAL | 0 refills | Status: DC

## 2020-08-25 MED ORDER — BUPROPION HCL ER (XL) 150 MG PO TB24: 150.0000 mg | ORAL_TABLET | Freq: Every day | ORAL | 0 refills | Status: DC

## 2020-08-25 NOTE — Patient Instructions (Addendum)
1.Continuebupropion150 mg daily 2. Increase Concerta 27 mg daily  3. Next appointment- 1/19 at 1 PM

## 2020-09-15 NOTE — Progress Notes (Signed)
Virtual Visit via Telephone Note  I connected with Faith Allen on 09/21/20 at  1:00 PM EST by telephone and verified that I am speaking with the correct person using two identifiers.  Location: Patient: in town Provider: office Persons participated in the visit- patient, provider   I discussed the limitations, risks, security and privacy concerns of performing an evaluation and management service by telephone and the availability of in person appointments. I also discussed with the patient that there may be a patient responsible charge related to this service. The patient expressed understanding and agreed to proceed.   I discussed the assessment and treatment plan with the patient. The patient was provided an opportunity to ask questions and all were answered. The patient agreed with the plan and demonstrated an understanding of the instructions.   The patient was advised to call back or seek an in-person evaluation if the symptoms worsen or if the condition fails to improve as anticipated.  I provided 12 minutes of non-face-to-face time during this encounter.   Norman Clay, MD    [facility] MD/PA/NP OP Progress Note  09/21/2020 1:28 PM Faith Allen  MRN:  [id number]  Chief Complaint:  Chief Complaint    Follow-up; Other     HPI:  This is a follow-up appointment for depression and ADHD.  She states that she feels the same as the last time.  However, on further elaboration, she has been able to focus better easily.  Although she still tends to be sidetracked, she is also hoping to work on herself.  She is trying to putting reminders, and making lists.  She is trying to make a habit of doing these every day.  She denies any concerns at work.  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  She reports good relationship with her children.  She notices that cooking has been easier compared to before.  She reports improvement in sleep.  She feels less fatigue.  She denies change in  appetite or weight.  She denies SI.  She tends to be forgetful.  She is not procrastinating as much compared to before.  She drinks 1 to 2 margarita per week.  She denies drug use.  She denies any side effects since up titration of Concerta.    Daily routine:spends time with her husbandandchildrenin the morning. She goesto park orvisits her fatheruntil her husband return from work Employment:works at [employer] and four children Marital status:married since 1990 Number of children:4, age 66, 77, 16, 32 Education:one year college ofEarly childhood education. Shereceived certificate,She hasGED; she stopped going to school at12 th grade. Although she does not remember the reason, she states that she wanted to hang out with her friends instead.  She moved to [place] at age 28  Visit Diagnosis:    ICD-10-CM   1. Attention deficit hyperactivity disorder (ADHD), unspecified ADHD type  F90.9   2. Major depressive disorder with single episode, in full remission ([place])  F32.5     Past Psychiatric History: Please see initial evaluation for full details. I have reviewed the history. No updates at this time.     Past Medical History:  Past Medical History:  Diagnosis Date  . Attention deficit disorder   . Medical history non-contributory     Past Surgical History:  Procedure Laterality Date  . NO PAST SURGERIES    . TUBAL LIGATION Bilateral 10/07/2016   Procedure: POST PARTUM TUBAL LIGATION;  Surgeon: Florian Buff, MD;  Location: [facility];  Service: Gynecology;  Laterality: Bilateral;    Family Psychiatric History: Please see initial evaluation for full details. I have reviewed the history. No updates at this time.     Family History:  Family History  Problem Relation Age of Onset  . Diabetes Mother   . Asthma Mother   . Miscarriages / Stillbirths Sister   . Irritable bowel syndrome Sister   . Irritable bowel syndrome Sister   . Anxiety disorder Sister   .  Inflammatory bowel disease Neg Hx   . Colon cancer Neg Hx     Social History:  Social History   Socioeconomic History  . Marital status: Married    Spouse name: Not on file  . Number of children: 4  . Years of education: Not on file  . Highest education level: Not on file  Occupational History  . 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 Smoking status: Former Smoker    Years: 1.00    Types: Cigarettes    Quit date: 07/05/2007    Years since quitting: 13.2  . Smokeless tobacco: Never Used  . Tobacco comment: Would smoke socially  Substance and Sexual Activity  . Alcohol use: Yes    Alcohol/week: 3.0 standard drinks    Types: 2 Glasses of wine, 1 Shots of liquor per week    Comment: Once a month  . Drug use: No  . Sexual activity: Not Currently    Birth control/protection: Surgical    Comment: tubal  Other Topics Concern  . Not on file  Social History Narrative  . Not on file   Social Determinants of Health   Financial Resource Strain: Not on file  Food Insecurity: Not on file  Transportation Needs: Not on file  Physical Activity: Not on file  Stress: Not on file  Social Connections: Not on file    Allergies: No Known Allergies  Metabolic Disorder Labs: Lab Results  Component Value Date   HGBA1C 5.4 08/03/2019   MPG 108 08/03/2019   No results found for: PROLACTIN Lab Results  Component Value Date   CHOL 146 08/03/2019   TRIG 108 08/03/2019   HDL 46 (L) 08/03/2019   CHOLHDL 3.2 08/03/2019   [place] 80 08/03/2019   Lab Results  Component Value Date   TSH 0.89 07/27/2020   TSH 1.54 08/03/2019    Therapeutic Level Labs: No results found for: LITHIUM No results found for: VALPROATE No components found for:  CBMZ  Current Medications: Current Outpatient Medications  Medication Sig Dispense Refill  . methylphenidate 36 MG PO CR tablet Take 1 tablet (36 mg total) by mouth daily. 30 tablet 0  . amoxicillin-clavulanate (AUGMENTIN) 875-125 MG tablet  Take 1 tablet by mouth 2 (two) times daily. 20 tablet 0  . buPROPion (WELLBUTRIN XL) 150 MG 24 hr tablet Take 1 tablet (150 mg total) by mouth daily. 30 tablet 0  . Emollient (ROC RETINOL CORREXION) CREA Apply topically.    Marland Kitchen ibuprofen (ADVIL) 800 MG tablet Take 1 tablet (800 mg total) by mouth 3 (three) times daily. 42 tablet 0  . methylphenidate (CONCERTA) 27 MG PO CR tablet Take 1 tablet (27 mg total) by mouth daily. 30 tablet 0  . mupirocin cream (BACTROBAN) 2 % Apply 1 application topically 2 (two) times daily. 15 g 0  . predniSONE (DELTASONE) 20 MG tablet Take 2 tablets (40 mg total) by mouth daily with breakfast. 10 tablet 0   No current facility-administered medications for this  visit.     Musculoskeletal: Strength & Muscle Tone: N/A Gait & Station: N/A Patient leans: N/A  Psychiatric Specialty Exam: Review of Systems  Psychiatric/Behavioral: Positive for decreased concentration. Negative for agitation, behavioral problems, confusion, dysphoric mood, hallucinations, self-injury, sleep disturbance and suicidal ideas. The patient is not nervous/anxious and is not hyperactive.   All other systems reviewed and are negative.   currently breastfeeding.There is no height or weight on file to calculate BMI.  General Appearance: NA  Eye Contact:  NA  Speech:  Clear and Coherent  Volume:  Normal  Mood:  good  Affect:  NA  Thought Process:  Coherent  Orientation:  Full (Time, Place, and Person)  Thought Content: Logical   Suicidal Thoughts:  No  Homicidal Thoughts:  No  Memory:  Immediate;   Good  Judgement:  Good  Insight:  Good  Psychomotor Activity:  Normal  Concentration:  Concentration: Good and Attention Span: Good  Recall:  Good  Fund of Knowledge: Good  Language: Good  Akathisia:  No  Handed:  Right  AIMS (if indicated): not done  Assets:  Communication Skills Desire for Improvement  ADL's:  Intact  Cognition: WNL  Sleep:  Fair   Screenings: PHQ2-9    [place] Office Visit from 08/03/2019 in [place]  PHQ-2 Total Score 2  PHQ-9 Total Score 14       Assessment and Plan:  Faith Allen is a 35 y.o. year old female with a history of depression , who presents for follow up appointment for below.   1. Attention deficit hyperactivity disorder (ADHD), unspecified ADHD type She reports slight improvement in inattention since up titration of Concerta.  Will do further up titration of Concerta to optimize treatment for ADHD.  She is aware of its potential risks, which includes but not limited to palpitation, worsening in anxiety, headache and insomnia.   2. Major depressive disorder with single episode, in full remission ([place]) She denies significant mood symptoms since consistently taking bupropion.  We will continue current dose to target depression.  She has no known history of seizure.   Plan 1. Continuebupropion150 mg daily 2. Increase Concerta 36 mg daily - monitor headache, appetite loss 3.Next appointment-2/16 at 1:20 PM for 20 mins, video Father cell [phone], **[email or web address] - discussed attendance policy  The patient demonstrates the following risk factors for suicide: Chronic risk factors for suicide include:psychiatric disorder ofnone. Acute risk factorsfor suicide include: unemployment. Protective factorsfor this patient include: responsibility to others (children, family) and hope for the future. Considering these factors, the overall suicide risk at this point appears to below. Patientisappropriate for outpatient follow up.  Norman Clay, MD 09/21/2020, 1:28 PM

## 2020-09-21 ENCOUNTER — Other Ambulatory Visit: Payer: Self-pay

## 2020-09-21 ENCOUNTER — Telehealth (INDEPENDENT_AMBULATORY_CARE_PROVIDER_SITE_OTHER): Payer: PRIVATE HEALTH INSURANCE | Admitting: Psychiatry

## 2020-09-21 ENCOUNTER — Encounter: Payer: Self-pay | Admitting: Psychiatry

## 2020-09-21 DIAGNOSIS — F325 Major depressive disorder, single episode, in full remission: Secondary | ICD-10-CM

## 2020-09-21 DIAGNOSIS — F909 Attention-deficit hyperactivity disorder, unspecified type: Secondary | ICD-10-CM

## 2020-09-21 MED ORDER — METHYLPHENIDATE HCL ER (OSM) 36 MG PO TBCR: 36.0000 mg | EXTENDED_RELEASE_TABLET | Freq: Every day | ORAL | 0 refills | Status: DC

## 2020-09-28 ENCOUNTER — Telehealth: Payer: Self-pay

## 2020-09-28 ENCOUNTER — Other Ambulatory Visit: Payer: Self-pay | Admitting: Psychiatry

## 2020-09-28 MED ORDER — BUPROPION HCL ER (XL) 150 MG PO TB24: 150.0000 mg | ORAL_TABLET | Freq: Every day | ORAL | 0 refills | Status: DC

## 2020-09-28 NOTE — Telephone Encounter (Signed)
Received a fax requesting a refill on the bupropion.  Pt last seen on 09-21-20.  Next appt 10-19-20  buPROPion (WELLBUTRIN XL) 150 MG 24 hr tablet Medication Date: 08/25/2020 Department: [facility] Ordering/Authorizing: Norman Clay, MD    Order Providers  Prescribing Provider Encounter Provider  Norman Clay, MD Norman Clay, MD   Outpatient Medication Detail   Disp Refills Start End   buPROPion (WELLBUTRIN XL) 150 MG 24 hr tablet 30 tablet 0 08/25/2020    Sig - Route: Take 1 tablet (150 mg total) by mouth daily. - Oral   Sent to pharmacy as: buPROPion (WELLBUTRIN XL) 150 MG 24 hr tablet   E-Prescribing Status: Receipt confirmed by pharmacy (08/25/2020 9:32 AM EST)    Pharmacy  [facility], [address]

## 2020-10-13 NOTE — Progress Notes (Signed)
Virtual Visit via Video Note  I connected with SWATHI DAUPHIN on 10/19/20 at  1:20 PM EST by a video enabled telemedicine application and verified that I am speaking with the correct person using two identifiers.  Location: Patient: home Provider: office Persons participated in the visit- patient, provider   I discussed the limitations of evaluation and management by telemedicine and the availability of in person appointments. The patient expressed understanding and agreed to proceed.  I discussed the assessment and treatment plan with the patient. The patient was provided an opportunity to ask questions and all were answered. The patient agreed with the plan and demonstrated an understanding of the instructions.   The patient was advised to call back or seek an in-person evaluation if the symptoms worsen or if the condition fails to improve as anticipated.  I provided 17 minutes of non-face-to-face time during this encounter.   Norman Clay, MD    [facility] MD/PA/NP OP Progress Note  10/19/2020 1:45 PM KENNDRA MORRIS  MRN:  [id number]  Chief Complaint:  Chief Complaint    ADHD; Follow-up; Depression     HPI:  This is a follow-up appointment for depression and ADHD.  She states that she does not have dry mouth and headache anymore.  She also notices that there is no progression.  Although she continues to forget something, she knows that she is forgetting something compared to being clueless before being started on Concerta.  She was able to get some household chores done at night.  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  She does not feel stressed about work life balance anymore.  She has also started a strategy with her children so that she would not get distracted.  She will asked them to sit still so that she can do some work.  It has been working very well.  She enjoyed the time being by herself while other family went to watch Super Bowl.  She  sleeps well.  She denies feeling depressed.  She denies change in appetite.  She denies SI.  Although she is still distracted, it has been getting better.  While reviewing her medication, it was found out that she continues to take Concerta 27 mg instead of 36 mg.  She is willing to try higher dose at this time.  She drinks 2 glasses of wine occasionally.  She denies drug use.   Daily routine:spends time with her husbandandchildrenin the morning. She goesto park orvisits her fatheruntil her husband return from work Employment:works at [employer] and four children Marital status:married since 1990 Number of children:4, age 83, 4, 57, 71 Education:one year college ofEarly childhood education. Shereceived certificate,She hasGED; she stopped going to school at12 th grade. Although she does not remember the reason, she states that she wanted to hang out with her friends instead.  She moved to [place] at age 48  Visit Diagnosis:    ICD-10-CM   1. Attention deficit hyperactivity disorder (ADHD), unspecified ADHD type  F90.9   2. Major depressive disorder with single episode, in full remission ([place])  F32.5     Past Psychiatric History: Please see initial evaluation for full details. I have reviewed the history. No updates at this time.     Past Medical History:  Past Medical History:  Diagnosis Date  . Attention deficit disorder   . Medical history non-contributory     Past Surgical History:  Procedure Laterality Date  .  NO PAST SURGERIES    . TUBAL LIGATION Bilateral 10/07/2016   Procedure: POST PARTUM TUBAL LIGATION;  Surgeon: Florian Buff, MD;  Location: [facility];  Service: Gynecology;  Laterality: Bilateral;    Family Psychiatric History: Please see initial evaluation for full details. I have reviewed the history. No updates at this time.     Family History:  Family History  Problem Relation Age of Onset  . Diabetes Mother   . Asthma Mother   .  Miscarriages / Stillbirths Sister   . Irritable bowel syndrome Sister   . Irritable bowel syndrome Sister   . Anxiety disorder Sister   . Inflammatory bowel disease Neg Hx   . Colon cancer Neg Hx     Social History:  Social History   Socioeconomic History  . Marital status: Married    Spouse name: Not on file  . Number of children: 4  . Years of education: Not on file  . Highest education level: Not on file  Occupational History  . 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 Smoking status: Former Smoker    Years: 1.00    Types: Cigarettes    Quit date: 07/05/2007    Years since quitting: 13.3  . Smokeless tobacco: Never Used  . Tobacco comment: Would smoke socially  Substance and Sexual Activity  . Alcohol use: Yes    Alcohol/week: 3.0 standard drinks    Types: 2 Glasses of wine, 1 Shots of liquor per week    Comment: Once a month  . Drug use: No  . Sexual activity: Not Currently    Birth control/protection: Surgical    Comment: tubal  Other Topics Concern  . Not on file  Social History Narrative  . Not on file   Social Determinants of Health   Financial Resource Strain: Not on file  Food Insecurity: Not on file  Transportation Needs: Not on file  Physical Activity: Not on file  Stress: Not on file  Social Connections: Not on file    Allergies: No Known Allergies  Metabolic Disorder Labs: Lab Results  Component Value Date   HGBA1C 5.4 08/03/2019   MPG 108 08/03/2019   No results found for: PROLACTIN Lab Results  Component Value Date   CHOL 146 08/03/2019   TRIG 108 08/03/2019   HDL 46 (L) 08/03/2019   CHOLHDL 3.2 08/03/2019   [place] 80 08/03/2019   Lab Results  Component Value Date   TSH 0.89 07/27/2020   TSH 1.54 08/03/2019    Therapeutic Level Labs: No results found for: LITHIUM No results found for: VALPROATE No components found for:  CBMZ  Current Medications: Current Outpatient Medications  Medication Sig Dispense Refill  .  amoxicillin-clavulanate (AUGMENTIN) 875-125 MG tablet Take 1 tablet by mouth 2 (two) times daily. 20 tablet 0  . [START ON 10/29/2020] buPROPion (WELLBUTRIN XL) 150 MG 24 hr tablet Take 1 tablet (150 mg total) by mouth daily. 30 tablet 2  . Emollient (ROC RETINOL CORREXION) CREA Apply topically.    Marland Kitchen ibuprofen (ADVIL) 800 MG tablet Take 1 tablet (800 mg total) by mouth 3 (three) times daily. 42 tablet 0  . methylphenidate (CONCERTA) 27 MG PO CR tablet Take 1 tablet (27 mg total) by mouth daily. 30 tablet 0  . methylphenidate 36 MG PO CR tablet Take 1 tablet (36 mg total) by mouth daily. 30 tablet 0  . mupirocin cream (BACTROBAN) 2 % Apply 1 application topically 2 (two) times daily. 15 g 0  .  predniSONE (DELTASONE) 20 MG tablet Take 2 tablets (40 mg total) by mouth daily with breakfast. 10 tablet 0   No current facility-administered medications for this visit.     Musculoskeletal: Strength & Muscle Tone: N/A Gait & Station: N/A Patient leans: N/A  Psychiatric Specialty Exam: Review of Systems  Psychiatric/Behavioral: Positive for decreased concentration. Negative for agitation, behavioral problems, confusion, dysphoric mood, hallucinations, self-injury, sleep disturbance and suicidal ideas. The patient is not nervous/anxious and is not hyperactive.   All other systems reviewed and are negative.   currently breastfeeding.There is no height or weight on file to calculate BMI.  General Appearance: Fairly Groomed  Eye Contact:  Good  Speech:  Clear and Coherent  Volume:  Normal  Mood:  good  Affect:  Appropriate, Congruent and euthymic, appropriately brighter  Thought Process:  Coherent  Orientation:  Full (Time, Place, and Person)  Thought Content: Logical   Suicidal Thoughts:  No  Homicidal Thoughts:  No  Memory:  Immediate;   Good  Judgement:  Good  Insight:  Good  Psychomotor Activity:  Normal  Concentration:  Concentration: Good and Attention Span: Good  Recall:  Good  Fund  of Knowledge: Good  Language: Good  Akathisia:  No  Handed:  Right  AIMS (if indicated): not done  Assets:  Communication Skills Desire for Improvement  ADL's:  Intact  Cognition: WNL  Sleep:  Good   Screenings: PHQ2-9   Flowsheet Row Video Visit from 10/19/2020 in [place] Office Visit from 08/03/2019 in [place]  PHQ-2 Total Score 0 2  PHQ-9 Total Score -- 14    Flowsheet Row Video Visit from 10/19/2020 in [place] No Risk       Assessment and Plan:  ALI MCLAURIN is a 35 y.o. year old female with a history of  depression, who presents for follow up appointment for below.   1. Attention deficit hyperactivity disorder (ADHD), unspecified ADHD type There has been steady improvement in inattention since the last visit.  She accidentally did not uptitrate Concerta, although she is willing to try this.  Will do up titration of Concerta to optimize treatment for ADHD.  Discussed its potential risk, which includes but not limited to palpitation, worsening in anxiety and insomnia.   2. Major depressive disorder with single episode, in full remission [facility]) She denies significant mood symptoms since the last visit.  Discussed the importance of medication adherence to optimize treatment for depression.  Will continue current dose to target depression.   Plan I have reviewed and updated plans as below 1.Continuebupropion150 mg daily 2. Increase Concerta 36 mg daily - monitor headache, appetite loss 3.Next appointment-3/15 at 1:20 PMfor 20 mins, video Father cell phone-[phone], **[email or web address] - discussed attendance policy  The patient demonstrates the following risk factors for suicide: Chronic risk factors for suicide include:psychiatric disorder ofnone. Acute risk factorsfor suicide include: unemployment. Protective factorsfor this patient include: responsibility  to others (children, family) and hope for the future. Considering these factors, the overall suicide risk at this point appears to below. Patientisappropriate for outpatient follow up.   Norman Clay, MD 10/19/2020, 1:45 PM

## 2020-10-19 ENCOUNTER — Other Ambulatory Visit: Payer: Self-pay

## 2020-10-19 ENCOUNTER — Encounter: Payer: Self-pay | Admitting: Psychiatry

## 2020-10-19 ENCOUNTER — Telehealth (INDEPENDENT_AMBULATORY_CARE_PROVIDER_SITE_OTHER): Payer: PRIVATE HEALTH INSURANCE | Admitting: Psychiatry

## 2020-10-19 DIAGNOSIS — F909 Attention-deficit hyperactivity disorder, unspecified type: Secondary | ICD-10-CM

## 2020-10-19 DIAGNOSIS — F325 Major depressive disorder, single episode, in full remission: Secondary | ICD-10-CM | POA: Diagnosis not present

## 2020-10-19 MED ORDER — BUPROPION HCL ER (XL) 150 MG PO TB24: 150.0000 mg | ORAL_TABLET | Freq: Every day | ORAL | 2 refills | Status: DC

## 2020-10-20 ENCOUNTER — Ambulatory Visit (INDEPENDENT_AMBULATORY_CARE_PROVIDER_SITE_OTHER): Payer: No Typology Code available for payment source | Admitting: Nurse Practitioner

## 2020-10-20 VITALS — BP 104/60 | HR 78 | Temp 98.1°F | Ht 60.0 in | Wt 141.2 lb

## 2020-10-20 DIAGNOSIS — R1310 Dysphagia, unspecified: Secondary | ICD-10-CM | POA: Diagnosis not present

## 2020-10-20 DIAGNOSIS — R21 Rash and other nonspecific skin eruption: Secondary | ICD-10-CM | POA: Diagnosis not present

## 2020-10-20 DIAGNOSIS — D649 Anemia, unspecified: Secondary | ICD-10-CM

## 2020-10-20 DIAGNOSIS — Z1159 Encounter for screening for other viral diseases: Secondary | ICD-10-CM | POA: Diagnosis not present

## 2020-10-20 DIAGNOSIS — E559 Vitamin D deficiency, unspecified: Secondary | ICD-10-CM

## 2020-10-20 DIAGNOSIS — Z0000 Encounter for general adult medical examination without abnormal findings: Secondary | ICD-10-CM

## 2020-10-20 DIAGNOSIS — Q139 Congenital malformation of anterior segment of eye, unspecified: Secondary | ICD-10-CM

## 2020-10-20 MED ORDER — CLOTRIMAZOLE-BETAMETHASONE 1-0.05 % EX CREA: 1.0000 "application " | TOPICAL_CREAM | Freq: Two times a day (BID) | CUTANEOUS | 0 refills | Status: DC

## 2020-10-20 NOTE — Progress Notes (Signed)
Subjective:  [name], female    DOB: [birth year]  Age: 35 y.o. MRN: [id number]  CC:  Chief Complaint  Patient presents with  . Other    Vitamin D Deficiency, intermittent yellowing of sclera, rash, concerns about oxygen, dysphagia  . Anemia      HPI  This patient arrives today for the above.  Anemia: She has a history of microcytic anemia.  Further work-up has not been completed for this at this time.  Vitamin D deficiency: She has been found to have deficiency in vitamin D and tells me she is no longer taking her 5000 IUs of vitamin D3 daily.  She is open to restarting this.  Intermittent yellowing of sclera: She has noticed that sometimes the whites of her eyes start to turn yellow.  She is not sure what the importance of this is, but want to bring this up in the office today.  Rash: For the last few weeks she has noted some dry cracking skin on her hands, and would like to have this evaluated.  Dysphagia: She tells me that she experiences a sensation of either pills or food getting lodged in her throat and this has been going on for quite some time.  She denies experiencing this for liquids.  Concerns about oxygen: She is concerned she may not be getting enough oxygen to her brain intermittently.  She is worried about possibly having sleep apnea.  She denies being told that she snores or having anyone noticed apneic episodes when she is asleep.  She denies daytime sleepiness.  She tells me sometimes when she is resting she noticed that her husband is taking deeper breaths than she is and she is concerned that her airways are small.  She denies any personal history of asthma but she does have significant family history of asthma.  She denies any wheezing or significant shortness of breath.  Past Medical History:  Diagnosis Date  . Attention deficit disorder   . Medical history non-contributory       Family History  Problem Relation Age of Onset  .  Diabetes Mother   . Asthma Mother   . Miscarriages / Stillbirths Sister   . Irritable bowel syndrome Sister   . Irritable bowel syndrome Sister   . Anxiety disorder Sister   . Inflammatory bowel disease Neg Hx   . Colon cancer Neg Hx     Social History   Social History Narrative  . Not on file   Social History   Tobacco Use  . Smoking status: Former Smoker    Years: 1.00    Types: Cigarettes    Quit date: 07/05/2007    Years since quitting: 13.3  . Smokeless tobacco: Never Used  . Tobacco comment: Would smoke socially  Substance Use Topics  . Alcohol use: Yes    Alcohol/week: 3.0 standard drinks    Types: 2 Glasses of wine, 1 Shots of liquor per week    Comment: Once a month     Current Meds  Medication Sig  . [START ON 10/29/2020] buPROPion (WELLBUTRIN XL) 150 MG 24 hr tablet Take 1 tablet (150 mg total) by mouth daily.  . Cholecalciferol 1.25 MG (50000 UT) TABS Take 5,000 Int'l Units by mouth.  . clotrimazole-betamethasone (LOTRISONE) cream Apply 1 application topically 2 (two) times daily.  . Emollient (ROC RETINOL CORREXION) CREA Apply topically.  Marland Kitchen ibuprofen (ADVIL) 800 MG tablet Take 1 tablet (800 mg total) by mouth  3 (three) times daily.  . methylphenidate 36 MG PO CR tablet Take 1 tablet (36 mg total) by mouth daily.  . mupirocin cream (BACTROBAN) 2 % Apply 1 application topically 2 (two) times daily.    ROS:  See HPI   Objective:   Today's Vitals: BP 104/60   Pulse 78   Temp 98.1 F (36.7 C)   Ht 5' (1.524 m)   Wt 141 lb 3.2 oz (64 kg)   LMP 10/06/2020   SpO2 99%   Breastfeeding No   BMI 27.58 kg/m  Vitals with BMI 10/20/2020 08/15/2020 10/21/2019  Height '5\' 0"'  (No Data) -  Weight 141 lbs 3 oz (No Data) -  BMI 60.10 - -  Systolic 932 (No Data) 355  Diastolic 60 (No Data) 73  Pulse 78 - 73     Physical Exam Vitals reviewed.  Constitutional:      General: She is not in acute distress.    Appearance: Normal appearance.  HENT:     Head:  Normocephalic and atraumatic.  Eyes:     General: No scleral icterus.    Conjunctiva/sclera: Conjunctivae normal.  Neck:     Vascular: No carotid bruit.  Cardiovascular:     Rate and Rhythm: Normal rate and regular rhythm.     Pulses: Normal pulses.     Heart sounds: Normal heart sounds.  Pulmonary:     Effort: Pulmonary effort is normal.     Breath sounds: Normal breath sounds.  Skin:    General: Skin is warm and dry.     Comments: Dry skin with fissures between right first and second digit.   Neurological:     General: No focal deficit present.     Mental Status: She is alert and oriented to person, place, and time.  Psychiatric:        Mood and Affect: Mood normal.        Behavior: Behavior normal.        Judgment: Judgment normal.          Assessment and Plan   1. Rash   2. Dysphagia, unspecified type   3. Anemia, unspecified type   4. Encounter for hepatitis C screening test for low risk patient   5. Vitamin D deficiency disease   6. Anomaly of sclera   7. Healthcare maintenance      Plan: 1.  I believe this most likely represents dry skin.  For now I recommended that she try using Aquaphor or Vaseline application to both hands twice a day for 2 weeks.  If this does not resolve the rash then she can trial use of Lotrisone cream twice a day. 2.  We will refer to gastroenterology for further evaluation of possible esophageal stricture. 3., 4  We will check blood work for further evaluation today. 5.  We will hold off on checking vitamin D level today as she is not been taking her vitamin D3 supplement but have encouraged her to start taking a vitamin D3 supplement daily. 6.  No yellowing of the sclera noted on exam today.  We will check CMP and liver function test for further evaluation today. 7.  I do not see any physical signs of hypoxemia.  Per shared decision making she will consider getting an over-the-counter pulse oximetry and checking her oxygen  intermittently.  She was told if she notices any readings less than 95% to let me know and we can consider further evaluation at that time.  We  also discussed possibly being evaluated for sleep apnea with a sleep study, but for now she like to hold off and consider doing this if she notices any hypoxemia on her pulse oximeter.   Tests ordered Orders Placed This Encounter  Procedures  . CBC with Differential/Platelets  . Ferritin  . Iron  . CMP with eGFR([facility])  . Protime-INR  . Hep C Antibody  . Ambulatory referral to Gastroenterology      Meds ordered this encounter  Medications  . clotrimazole-betamethasone (LOTRISONE) cream    Sig: Apply 1 application topically 2 (two) times daily.    Dispense:  30 g    Refill:  0    Order Specific Question:   Supervising Provider    Answer:   Doree Albee [[id number]]    Patient to follow-up in 3 months or sooner as needed for annual physical exam.  Ailene Ards, NP

## 2020-10-21 ENCOUNTER — Other Ambulatory Visit (INDEPENDENT_AMBULATORY_CARE_PROVIDER_SITE_OTHER): Payer: Self-pay | Admitting: Nurse Practitioner

## 2020-10-21 LAB — CBC WITH DIFFERENTIAL/PLATELET
Absolute Monocytes: 448 cells/uL (ref 200–950)
Basophils Absolute: 30 cells/uL (ref 0–200)
Basophils Relative: 0.5 %
Eosinophils Absolute: 100 cells/uL (ref 15–500)
Eosinophils Relative: 1.7 %
HCT: 35.3 % (ref 35.0–45.0)
Hemoglobin: 11.3 g/dL — ABNORMAL LOW (ref 11.7–15.5)
Lymphs Abs: 1646 cells/uL (ref 850–3900)
MCH: 24.5 pg — ABNORMAL LOW (ref 27.0–33.0)
MCHC: 32 g/dL (ref 32.0–36.0)
MCV: 76.6 fL — ABNORMAL LOW (ref 80.0–100.0)
MPV: 11.7 fL (ref 7.5–12.5)
Monocytes Relative: 7.6 %
Neutro Abs: 3676 cells/uL (ref 1500–7800)
Neutrophils Relative %: 62.3 %
Platelets: 286 10*3/uL (ref 140–400)
RBC: 4.61 10*6/uL (ref 3.80–5.10)
RDW: 15 % (ref 11.0–15.0)
Total Lymphocyte: 27.9 %
WBC: 5.9 10*3/uL (ref 3.8–10.8)

## 2020-10-21 LAB — COMPLETE METABOLIC PANEL WITH GFR
AG Ratio: 1.7 (calc) (ref 1.0–2.5)
ALT: 12 U/L (ref 6–29)
AST: 15 U/L (ref 10–30)
Albumin: 4.2 g/dL (ref 3.6–5.1)
Alkaline phosphatase (APISO): 59 U/L (ref 31–125)
BUN: 10 mg/dL (ref 7–25)
CO2: 27 mmol/L (ref 20–32)
Calcium: 9 mg/dL (ref 8.6–10.2)
Chloride: 104 mmol/L (ref 98–110)
Creat: 0.68 mg/dL (ref 0.50–1.10)
GFR, Est African American: 134 mL/min/{1.73_m2} (ref >=60)
GFR, Est Non African American: 116 mL/min/{1.73_m2} (ref >=60)
Globulin: 2.5 g/dL (calc) (ref 1.9–3.7)
Glucose, Bld: 106 mg/dL (ref 65–139)
Potassium: 3.5 mmol/L (ref 3.5–5.3)
Sodium: 139 mmol/L (ref 135–146)
Total Bilirubin: 0.3 mg/dL (ref 0.2–1.2)
Total Protein: 6.7 g/dL (ref 6.1–8.1)

## 2020-10-21 LAB — HEPATITIS C ANTIBODY
Hepatitis C Ab: NONREACTIVE
SIGNAL TO CUT-OFF: 0.02 (ref <1.00)

## 2020-10-21 LAB — IRON: Iron: 16 ug/dL — ABNORMAL LOW (ref 40–190)

## 2020-10-21 LAB — PROTIME-INR
INR: 1.1
Prothrombin Time: 11 s (ref 9.0–11.5)

## 2020-10-21 LAB — FERRITIN: Ferritin: 3 ng/mL — ABNORMAL LOW (ref 16–154)

## 2020-10-21 MED ORDER — FERROUS SULFATE 324 (65 FE) MG PO TBEC: 1.0000 | DELAYED_RELEASE_TABLET | Freq: Every day | ORAL | 0 refills | Status: DC

## 2020-10-24 ENCOUNTER — Encounter: Payer: Self-pay | Admitting: Internal Medicine

## 2020-10-25 ENCOUNTER — Other Ambulatory Visit: Payer: No Typology Code available for payment source

## 2020-10-25 DIAGNOSIS — Z20822 Contact with and (suspected) exposure to covid-19: Secondary | ICD-10-CM

## 2020-10-26 LAB — NOVEL CORONAVIRUS, NAA: SARS-CoV-2, NAA: DETECTED — AB

## 2020-10-26 LAB — SARS-COV-2, NAA 2 DAY TAT

## 2020-10-27 ENCOUNTER — Telehealth: Payer: Self-pay

## 2020-10-27 NOTE — Telephone Encounter (Signed)
Called to discuss with patient about COVID-19 symptoms and the use of one of the available treatments for those with mild to moderate Covid symptoms and at a high risk of hospitalization.  Pt appears to qualify for outpatient treatment due to co-morbid conditions and/or a member of an at-risk group in accordance with the FDA Emergency Use Authorization.    Symptom onset: 10/22/20 Vaccinated: Unknown Booster? Unknown Immunocompromised? No Qualifiers: None per chart  Unable to reach pt - Left message and call back number [phone].  Marcello Moores

## 2020-10-27 NOTE — Telephone Encounter (Signed)
Called to discuss with patient about COVID-19 symptoms and the use of one of the available treatments for those with mild to moderate Covid symptoms and at a high risk of hospitalization.  Pt appears to qualify for outpatient treatment due to co-morbid conditions and/or a member of an at-risk group in accordance with the FDA Emergency Use Authorization.    Symptom onset: 10/22/20 Cough Vaccinated: No Booster? No Immunocompromised? No Qualifiers: None per pt.  Declines.  Faith Allen

## 2020-11-14 ENCOUNTER — Encounter (INDEPENDENT_AMBULATORY_CARE_PROVIDER_SITE_OTHER): Payer: Self-pay | Admitting: Internal Medicine

## 2020-11-14 ENCOUNTER — Telehealth (INDEPENDENT_AMBULATORY_CARE_PROVIDER_SITE_OTHER): Payer: No Typology Code available for payment source | Admitting: Internal Medicine

## 2020-11-14 VITALS — Ht 60.0 in

## 2020-11-14 DIAGNOSIS — J0100 Acute maxillary sinusitis, unspecified: Secondary | ICD-10-CM | POA: Diagnosis not present

## 2020-11-14 MED ORDER — AMOXICILLIN-POT CLAVULANATE 875-125 MG PO TABS: 1.0000 | ORAL_TABLET | Freq: Two times a day (BID) | ORAL | 0 refills | Status: DC

## 2020-11-14 NOTE — Progress Notes (Signed)
Metrics: Intervention Frequency ACO  Documented Smoking Status Yearly  Screened one or more times in 24 months  Cessation Counseling or  Active cessation medication Past 24 months  Past 24 months   Guideline developer: UpToDate (See UpToDate for funding source) Date Released: 2014       Wellness Office Visit  Subjective:  [name], female    DOB: Mar 23, 1986  Age: 35 y.o. MRN: [id number]  CC: This is an audio telemedicine visit with the permission of the patient who is at home and I am in my office.  Attempt was made at video telemedicine visit but this was unsuccessful.  I used 2 identifiers to identify the patient. Left facial congestion. HPI  She has had the above symptoms for the last 24 hours and in the past this is usually turned into a bad sinus infection.  She has had some nasal congestion but no fever or any significant pain.  There is no cough.  She did have COVID-19 disease at the end of February just about 2 weeks ago and she seems to have made a decent recovery from this. Past Medical History:  Diagnosis Date  . Attention deficit disorder   . Medical history non-contributory    Past Surgical History:  Procedure Laterality Date  . NO PAST SURGERIES    . TUBAL LIGATION Bilateral 10/07/2016   Procedure: POST PARTUM TUBAL LIGATION;  Surgeon: Florian Buff, MD;  Location: [facility];  Service: Gynecology;  Laterality: Bilateral;     Family History  Problem Relation Age of Onset  . Diabetes Mother   . Asthma Mother   . Miscarriages / Stillbirths Sister   . Irritable bowel syndrome Sister   . Irritable bowel syndrome Sister   . Anxiety disorder Sister   . Inflammatory bowel disease Neg Hx   . Colon cancer Neg Hx     Social History   Social History Narrative  . Not on file   Social History   Tobacco Use  . Smoking status: Former Smoker    Years: 1.00    Types: Cigarettes    Quit date: 07/05/2007    Years since quitting: 13.3  . Smokeless tobacco:  Never Used  . Tobacco comment: Would smoke socially  Substance Use Topics  . Alcohol use: Yes    Alcohol/week: 3.0 standard drinks    Types: 2 Glasses of wine, 1 Shots of liquor per week    Comment: Once a month    Current Meds  Medication Sig  . amoxicillin-clavulanate (AUGMENTIN) 875-125 MG tablet Take 1 tablet by mouth 2 (two) times daily.  Marland Kitchen buPROPion (WELLBUTRIN XL) 150 MG 24 hr tablet Take 1 tablet (150 mg total) by mouth daily.  . Cholecalciferol 1.25 MG (50000 UT) TABS Take 5,000 Int'l Units by mouth.  Marland Kitchen ibuprofen (ADVIL) 800 MG tablet Take 1 tablet (800 mg total) by mouth 3 (three) times daily. (Patient taking differently: Take 800 mg by mouth as needed.)  . methylphenidate 36 MG PO CR tablet Take 1 tablet (36 mg total) by mouth daily.     [place] Office Visit from 08/03/2019 in [facility]  PHQ-9 Total Score 14      Objective:   Today's Vitals: Ht 5' (1.524 m)   BMI 27.58 kg/m  Vitals with BMI 11/14/2020 10/20/2020 08/15/2020  Height 5\' 0"  5\' 0"  (No Data)  Weight (No Data) 141 lbs 3 oz (No Data)  BMI - 81.82 -  Systolic (No Data)  104 (No Data)  Diastolic (No Data) 60 (No Data)  Pulse (No Data) 78 -     Physical Exam  Virtual visit.  She appears to be alert and orientated on the phone.     Assessment   1. Acute non-recurrent maxillary sinusitis       Tests ordered No orders of the defined types were placed in this encounter.    Plan: 1. I will treat her empirically for sinusitis that historically is likely going to be the case with Augmentin.  If she does not improve in about a week's time, she will let us know.  I doubt that this is COVID-19 disease as it is too soon after her recent illness of COVID-19. 2. This phone call lasted 5 minutes and 6 seconds.   Meds ordered this encounter  Medications  . amoxicillin-clavulanate (AUGMENTIN) 875-125 MG tablet    Sig: Take 1 tablet by mouth 2 (two) times daily.    Dispense:  20 tablet     Refill:  0    Carder Yin Luther Parody, MD

## 2020-11-26 NOTE — Progress Notes (Deleted)
Referring Provider: Ailene Ards, NP Primary Care Physician:  Doree Albee, MD Primary Gastroenterologist:  Dr. Abbey Chatters  No chief complaint on file.   HPI:   Faith Allen is a 35 y.o. female presenting today at the request of Ailene Ards, NP for dysphagia and anemia.   Appears patient has mild anemia dating back to December 2017 with hemoglobin in the 10-11 range with microcytic indices. Most recent labs February 2022 with hemoglobin 11.3.  Ferritin 3 (L), iron 16 (L).  Kidney function within normal limits.  INR normal.  She was advised to start oral iron daily.  We last saw patient in April 2016.  She reported chronic history of constipation with bowel movements every 4 to 5 days.  Occasional associated abdominal cramping prior to bowel movements.  Also reported intermittent rectal bleeding in the past.  Discussed possibility of colonoscopy, but patient declined.  She was advised to increase dietary fiber and utilize Colace more regularly.  Today:   Past Medical History:  Diagnosis Date  . Attention deficit disorder   . Medical history non-contributory     Past Surgical History:  Procedure Laterality Date  . NO PAST SURGERIES    . TUBAL LIGATION Bilateral 10/07/2016   Procedure: POST PARTUM TUBAL LIGATION;  Surgeon: Florian Buff, MD;  Location: [facility];  Service: Gynecology;  Laterality: Bilateral;    Current Outpatient Medications  Medication Sig Dispense Refill  . amoxicillin-clavulanate (AUGMENTIN) 875-125 MG tablet Take 1 tablet by mouth 2 (two) times daily. 20 tablet 0  . buPROPion (WELLBUTRIN XL) 150 MG 24 hr tablet Take 1 tablet (150 mg total) by mouth daily. 30 tablet 2  . Cholecalciferol 1.25 MG (50000 UT) TABS Take 5,000 Int'l Units by mouth.    Marland Kitchen ibuprofen (ADVIL) 800 MG tablet Take 1 tablet (800 mg total) by mouth 3 (three) times daily. (Patient taking differently: Take 800 mg by mouth as needed.) 42 tablet 0  . methylphenidate 36 MG PO CR tablet Take 1  tablet (36 mg total) by mouth daily. 30 tablet 0   No current facility-administered medications for this visit.    Allergies as of 11/28/2020  . (No Known Allergies)    Family History  Problem Relation Age of Onset  . Diabetes Mother   . Asthma Mother   . Miscarriages / Stillbirths Sister   . Irritable bowel syndrome Sister   . Irritable bowel syndrome Sister   . Anxiety disorder Sister   . Inflammatory bowel disease Neg Hx   . Colon cancer Neg Hx     Social History   Socioeconomic History  . Marital status: Married    Spouse name: Not on file  . Number of children: 4  . Years of education: Not on file  . Highest education level: Not on file  Occupational History  . 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 Smoking status: Former Smoker    Years: 1.00    Types: Cigarettes    Quit date: 07/05/2007    Years since quitting: 13.4  . Smokeless tobacco: Never Used  . Tobacco comment: Would smoke socially  Vaping Use  . Vaping Use: Former  Substance and Sexual Activity  . Alcohol use: Yes    Alcohol/week: 3.0 standard drinks    Types: 2 Glasses of wine, 1 Shots of liquor per week    Comment: Once a month  . Drug use: No  . Sexual activity: Not Currently    Birth control/protection:  Surgical    Comment: tubal  Other Topics Concern  . Not on file  Social History Narrative  . Not on file   Social Determinants of Health   Financial Resource Strain: Not on file  Food Insecurity: Not on file  Transportation Needs: Not on file  Physical Activity: Not on file  Stress: Not on file  Social Connections: Not on file  Intimate Partner Violence: Not on file    Review of Systems: Gen: Denies any fever, chills, fatigue, weight loss, lack of appetite.  CV: Denies chest pain, heart palpitations, peripheral edema, syncope.  Resp: Denies shortness of breath at rest or with exertion. Denies wheezing or cough.  GI: Denies dysphagia or odynophagia. Denies jaundice, hematemesis,  fecal incontinence. GU : Denies urinary burning, urinary frequency, urinary hesitancy MS: Denies joint pain, muscle weakness, cramps, or limitation of movement.  Derm: Denies rash, itching, dry skin Psych: Denies depression, anxiety, memory loss, and confusion Heme: Denies bruising, bleeding, and enlarged lymph nodes.  Physical Exam: There were no vitals taken for this visit. General:   Alert and oriented. Pleasant and cooperative. Well-nourished and well-developed.  Head:  Normocephalic and atraumatic. Eyes:  Without icterus, sclera clear and conjunctiva pink.  Ears:  Normal auditory acuity. Nose:  No deformity, discharge,  or lesions. Mouth:  No deformity or lesions, oral mucosa pink.  Neck:  Supple, without mass or thyromegaly. Lungs:  Clear to auscultation bilaterally. No wheezes, rales, or rhonchi. No distress.  Heart:  S1, S2 present without murmurs appreciated.  Abdomen:  +BS, soft, non-tender and non-distended. No HSM noted. No guarding or rebound. No masses appreciated.  Rectal:  Deferred  Msk:  Symmetrical without gross deformities. Normal posture. Pulses:  Normal pulses noted. Extremities:  Without clubbing or edema. Neurologic:  Alert and  oriented x4;  grossly normal neurologically. Skin:  Intact without significant lesions or rashes. Cervical Nodes:  No significant cervical adenopathy. Psych:  Alert and cooperative. Normal mood and affect.

## 2020-11-28 ENCOUNTER — Ambulatory Visit: Payer: No Typology Code available for payment source | Admitting: Gastroenterology

## 2021-01-17 ENCOUNTER — Encounter (INDEPENDENT_AMBULATORY_CARE_PROVIDER_SITE_OTHER): Payer: No Typology Code available for payment source | Admitting: Internal Medicine

## 2021-10-14 ENCOUNTER — Other Ambulatory Visit: Payer: Self-pay

## 2021-10-14 ENCOUNTER — Encounter: Payer: Self-pay | Admitting: Emergency Medicine

## 2021-10-14 ENCOUNTER — Ambulatory Visit
Admission: EM | Admit: 2021-10-14 | Discharge: 2021-10-14 | Disposition: A | Discharge status: Home / Self Care | Payer: No Typology Code available for payment source | Attending: Family Medicine | Admitting: Family Medicine

## 2021-10-14 DIAGNOSIS — J039 Acute tonsillitis, unspecified: Secondary | ICD-10-CM | POA: Diagnosis not present

## 2021-10-14 LAB — POCT RAPID STREP A (OFFICE): Rapid Strep A Screen: NEGATIVE

## 2021-10-14 MED ORDER — AMOXICILLIN 875 MG PO TABS: 875.0000 mg | ORAL_TABLET | Freq: Two times a day (BID) | ORAL | 0 refills | Status: DC

## 2021-10-14 MED ORDER — LIDOCAINE VISCOUS HCL 2 % MT SOLN: 10.0000 mL | OROMUCOSAL | 0 refills | Status: DC | PRN

## 2021-10-14 NOTE — ED Provider Notes (Signed)
[facility]    CSN: [id number] Arrival date & time: 10/14/21  1102      History   Chief Complaint Chief Complaint  Patient presents with   Sore Throat    HPI Faith Allen is a 36 y.o. female.   Presenting today with 5-day history of sore throat, sinus drainage, cough and now body aches, worsening sore, swollen feeling throat worse on the left.  Denies known fever, difficulty swallowing, difficulty breathing, chest pain, shortness of breath, abdominal pain, nausea vomiting or diarrhea.  So far not trying anything over-the-counter for symptoms.  Multiple sick contacts recently.   Past Medical History:  Diagnosis Date   Attention deficit disorder    Medical history non-contributory     Patient Active Problem List   Diagnosis Date Noted   Attention and concentration deficit 08/03/2019   Encounter for general adult medical examination with abnormal findings 08/03/2019   Fatigue 08/03/2019   S/P tubal ligation 11/16/2016   Constipation 12/31/2014   Rectal bleeding 12/31/2014   Uterine fibroids 03/03/2014    Past Surgical History:  Procedure Laterality Date   NO PAST SURGERIES     TUBAL LIGATION Bilateral 10/07/2016   Procedure: POST PARTUM TUBAL LIGATION;  Surgeon: Florian Buff, MD;  Location: [facility];  Service: Gynecology;  Laterality: Bilateral;    OB History     Gravida  4   Para  4   Term  4   Preterm      AB      Living  4      SAB      IAB      Ectopic      Multiple  0   Live Births  4            Home Medications    Prior to Admission medications   Medication Sig Start Date End Date Taking? Authorizing Provider  amoxicillin (AMOXIL) 875 MG tablet Take 1 tablet (875 mg total) by mouth 2 (two) times daily. 10/14/21  Yes Volney American, PA-C  lidocaine (XYLOCAINE) 2 % solution Use as directed 10 mLs in the mouth or throat every 3 (three) hours as needed for mouth pain. 10/14/21  Yes Volney American, PA-C   amoxicillin-clavulanate (AUGMENTIN) 875-125 MG tablet Take 1 tablet by mouth 2 (two) times daily. 11/14/20   Doree Albee, MD  buPROPion (WELLBUTRIN XL) 150 MG 24 hr tablet Take 1 tablet (150 mg total) by mouth daily. 10/29/20   Norman Clay, MD  Cholecalciferol 1.25 MG (50000 UT) TABS Take 5,000 Int'l Units by mouth.    [provider]  ibuprofen (ADVIL) 800 MG tablet Take 1 tablet (800 mg total) by mouth 3 (three) times daily. Patient taking differently: Take 800 mg by mouth as needed. 10/21/19   Avegno, Darrelyn Hillock, FNP  methylphenidate 36 MG PO CR tablet Take 1 tablet (36 mg total) by mouth daily. 09/21/20   Norman Clay, MD    Family History Family History  Problem Relation Age of Onset   Diabetes Mother    Asthma Mother    Miscarriages / [place] Sister    Irritable bowel syndrome Sister    Irritable bowel syndrome Sister    Anxiety disorder Sister    Inflammatory bowel disease Neg Hx    Colon cancer Neg Hx     Social History Social History   Tobacco Use   Smoking status: Former    Years: 1.00    Types: Cigarettes  Quit date: 07/05/2007    Years since quitting: 14.2   Smokeless tobacco: Never   Tobacco comments:    Would smoke socially  Vaping Use   Vaping Use: Former  Substance Use Topics   Alcohol use: Yes    Alcohol/week: 3.0 standard drinks    Types: 2 Glasses of wine, 1 Shots of liquor per week    Comment: Once a month   Drug use: No     Allergies   Patient has no known allergies.   Review of Systems Review of Systems Per HPI  Physical Exam Triage Vital Signs ED Triage Vitals  Enc Vitals Group     BP 10/14/21 1436 130/83     Pulse Rate 10/14/21 1436 86     Resp 10/14/21 1436 18     Temp 10/14/21 1436 98.6 F (37 C)     Temp Source 10/14/21 1436 Oral     SpO2 10/14/21 1436 96 %     Weight 10/14/21 1437 153 lb 6.4 oz (69.6 kg)     Height 10/14/21 1437 5' (1.524 m)     Head Circumference --      Peak Flow --      Pain Score  10/14/21 1437 7     Pain Loc --      Pain Edu? --      Excl. in [place]? --    No data found.  Updated Vital Signs BP 130/83 (BP Location: Right Arm)    Pulse 86    Temp 98.6 F (37 C) (Oral)    Resp 18    Ht 5' (1.524 m)    Wt 153 lb 6.4 oz (69.6 kg)    LMP 09/17/2021 (Approximate)    SpO2 96%    BMI 29.96 kg/m   Visual Acuity Right Eye Distance:   Left Eye Distance:   Bilateral Distance:    Right Eye Near:   Left Eye Near:    Bilateral Near:     Physical Exam Vitals and nursing note reviewed.  Constitutional:      Appearance: Normal appearance.  HENT:     Head: Atraumatic.     Right Ear: Tympanic membrane and external ear normal.     Left Ear: Tympanic membrane and external ear normal.     Nose: Rhinorrhea present.     Mouth/Throat:     Mouth: Mucous membranes are moist.     Pharynx: Oropharyngeal exudate and posterior oropharyngeal erythema present.     Comments: Bilateral tonsillar edema, erythema and left-sided exudates Eyes:     Extraocular Movements: Extraocular movements intact.     Conjunctiva/sclera: Conjunctivae normal.  Cardiovascular:     Rate and Rhythm: Normal rate and regular rhythm.     Heart sounds: Normal heart sounds.  Pulmonary:     Effort: Pulmonary effort is normal.     Breath sounds: Normal breath sounds. No wheezing or rales.  Musculoskeletal:        General: Normal range of motion.     Cervical back: Normal range of motion and neck supple.  Lymphadenopathy:     Cervical: Cervical adenopathy present.  Skin:    General: Skin is warm and dry.  Neurological:     Mental Status: She is alert and oriented to person, place, and time.     Motor: No weakness.     Gait: Gait normal.  Psychiatric:        Mood and Affect: Mood normal.  Thought Content: Thought content normal.   UC Treatments / Results  Labs (all labs ordered are listed, but only abnormal results are displayed) Labs Reviewed  POCT RAPID STREP A (OFFICE)    EKG  Radiology No results found.  Procedures Procedures (including critical care time)  Medications Ordered in UC Medications - No data to display  Initial Impression / Assessment and Plan / UC Course  I have reviewed the triage vital signs and the nursing notes.  Pertinent labs & imaging results that were available during my care of the patient were reviewed by me and considered in my medical decision making (see chart for details).     Exam findings and symptoms suspicious for strep tonsillitis.  She states she had multiple times in the past and it feels exactly like this.  We will treat with amoxicillin, viscous lidocaine, over-the-counter pain relievers and salt water gargles.  Return for acutely worsening symptoms.  Final Clinical Impressions(s) / UC Diagnoses   Final diagnoses:  Acute tonsillitis, unspecified etiology   Discharge Instructions   None    ED Prescriptions     Medication Sig Dispense Auth. Provider   amoxicillin (AMOXIL) 875 MG tablet Take 1 tablet (875 mg total) by mouth 2 (two) times daily. 20 tablet Volney American, PA-C   lidocaine (XYLOCAINE) 2 % solution Use as directed 10 mLs in the mouth or throat every 3 (three) hours as needed for mouth pain. 100 mL Volney American, [place]      PDMP not reviewed this encounter.   Volney American, [place] 10/14/21 1512

## 2021-10-14 NOTE — ED Triage Notes (Signed)
Pt reports sore throat, sinus drainage since Tuesday. Pt reports pain is better but reports now body is aching.

## 2022-12-04 ENCOUNTER — Encounter: Payer: Self-pay | Admitting: Family Medicine

## 2022-12-04 ENCOUNTER — Ambulatory Visit: Payer: Medicaid Other | Admitting: Family Medicine

## 2022-12-04 VITALS — BP 105/70 | HR 72 | Ht 60.0 in | Wt 151.0 lb

## 2022-12-04 DIAGNOSIS — R41840 Attention and concentration deficit: Secondary | ICD-10-CM

## 2022-12-04 DIAGNOSIS — Z8659 Personal history of other mental and behavioral disorders: Secondary | ICD-10-CM | POA: Diagnosis not present

## 2022-12-04 DIAGNOSIS — Z13220 Encounter for screening for lipoid disorders: Secondary | ICD-10-CM

## 2022-12-04 DIAGNOSIS — Z1321 Encounter for screening for nutritional disorder: Secondary | ICD-10-CM

## 2022-12-04 DIAGNOSIS — Z131 Encounter for screening for diabetes mellitus: Secondary | ICD-10-CM

## 2022-12-04 DIAGNOSIS — Z1329 Encounter for screening for other suspected endocrine disorder: Secondary | ICD-10-CM

## 2022-12-04 NOTE — Patient Instructions (Signed)
It was pleasure meeting with you today. Follow up with your primary health provider if any health concerns arises. If symptoms worsen please contact your primary care provider and/or visit the emergency department.  

## 2022-12-04 NOTE — Assessment & Plan Note (Addendum)
Patient reported often has difficulty holding attention on daily tasks, easily distracted and forgetful in daily activities. Patient has difficulty organizing task and activities,  Has hyperactivity-impulsivity symptoms such as fidgeting but can stay still once focused, sometimes Talks excessively, and often has trouble completing at home tasks then proceeds to daydream.  Referral to behavior health for psych consult for ADHD [place] Office Visit from 12/04/2022 in [facility]  PHQ-9 Total Score 13      Patient verbally consented to [facility] services about presenting concerns and psychiatric consultation as appropriate. The services will be billed as appropriate for the patient.

## 2022-12-04 NOTE — Progress Notes (Signed)
New Patient Office Visit   Subjective   [name], female    DOB: 06-Jun-1986  Age: 37 y.o. MRN: [id number]  CC:  Chief Complaint  Patient presents with   Establish Care    HPI Faith Allen 37 year old female, presents to establish care. She  has a past medical history of Anemia, Attention deficit disorder, and Medical history non-contributory. Refer to plan and assessment for details of today's visit  HPI    Outpatient Encounter Medications as of 12/04/2022  Medication Sig   amoxicillin (AMOXIL) 875 MG tablet Take 1 tablet (875 mg total) by mouth 2 (two) times daily.   amoxicillin-clavulanate (AUGMENTIN) 875-125 MG tablet Take 1 tablet by mouth 2 (two) times daily.   buPROPion (WELLBUTRIN XL) 150 MG 24 hr tablet Take 1 tablet (150 mg total) by mouth daily.   Cholecalciferol 1.25 MG (50000 UT) TABS Take 5,000 Int'l Units by mouth.   ibuprofen (ADVIL) 800 MG tablet Take 1 tablet (800 mg total) by mouth 3 (three) times daily. (Patient taking differently: Take 800 mg by mouth as needed.)   lidocaine (XYLOCAINE) 2 % solution Use as directed 10 mLs in the mouth or throat every 3 (three) hours as needed for mouth pain.   methylphenidate 36 MG PO CR tablet Take 1 tablet (36 mg total) by mouth daily.   No facility-administered encounter medications on file as of 12/04/2022.    Past Surgical History:  Procedure Laterality Date   NO PAST SURGERIES     TUBAL LIGATION Bilateral 10/07/2016   Procedure: POST PARTUM TUBAL LIGATION;  Surgeon: Florian Buff, MD;  Location: [facility];  Service: Gynecology;  Laterality: Bilateral;    Review of Systems  Constitutional:  Negative for chills and fever.  HENT:  Negative for hearing loss.   Eyes:  Negative for blurred vision.  Respiratory:  Negative for shortness of breath.   Cardiovascular:  Negative for chest pain.  Gastrointestinal:  Negative for heartburn.  Genitourinary:  Negative for dysuria.  Musculoskeletal:  Negative for  myalgias.  Neurological:  Negative for dizziness and headaches.  Psychiatric/Behavioral:  The patient is nervous/anxious.       Objective    BP 105/70   Pulse 72   Ht 5' (1.524 m)   Wt 151 lb (68.5 kg)   SpO2 97%   BMI 29.49 kg/m   Physical Exam Constitutional:      Appearance: Normal appearance.  HENT:     Head: Normocephalic.     Nose: Nose normal.  Cardiovascular:     Rate and Rhythm: Normal rate and regular rhythm.     Pulses: Normal pulses.  Pulmonary:     Effort: Pulmonary effort is normal.     Breath sounds: Normal breath sounds.  Abdominal:     General: Bowel sounds are normal.     Palpations: Abdomen is soft. There is no mass.     Tenderness: There is no right CVA tenderness, left CVA tenderness or guarding.  Musculoskeletal:        General: Normal range of motion.     Cervical back: Normal range of motion.     Right lower leg: No edema.     Left lower leg: No edema.  Skin:    General: Skin is warm.     Capillary Refill: Capillary refill takes less than 2 seconds.  Neurological:     General: No focal deficit present.     Mental Status: She is  alert.     Gait: Gait normal.     Deep Tendon Reflexes: Reflexes normal.  Psychiatric:        Thought Content: Thought content normal.       Assessment & Plan:  History of attention deficit hyperactivity disorder (ADHD) -     Amb ref to [facility] for lipid disorders -     CBC with Differential/Platelet -     CMP14+EGFR -     Lipid panel  Screening for thyroid disorder -     Hemoglobin A1c -     TSH + free T4  Screening for diabetes mellitus  Encounter for vitamin deficiency screening -     VITAMIN D 25 Hydroxy (Vit-D Deficiency, Fractures)  Attention and concentration deficit Assessment & Plan: Patient reported often has difficulty holding attention on daily tasks, easily distracted and forgetful in daily activities. Patient has difficulty organizing task and activities,   Has hyperactivity-impulsivity symptoms such as fidgeting but can stay still once focused, sometimes Talks excessively, and often has trouble completing at home tasks then proceeds to daydream.  Referral to behavior health for psych consult for ADHD [place] Office Visit from 12/04/2022 in [facility]  PHQ-9 Total Score 13      Patient verbally consented to [facility] services about presenting concerns and psychiatric consultation as appropriate. The services will be billed as appropriate for the patient.       Return in about 1 year (around 12/04/2023) for Annual Physical.   Renard Hamper Ria Comment, FNP

## 2022-12-05 LAB — CMP14+EGFR
ALT: 21 IU/L (ref 0–32)
AST: 21 IU/L (ref 0–40)
Albumin/Globulin Ratio: 1.5 (ref 1.2–2.2)
Albumin: 4 g/dL (ref 3.9–4.9)
Alkaline Phosphatase: 73 IU/L (ref 44–121)
BUN/Creatinine Ratio: 22 (ref 9–23)
BUN: 14 mg/dL (ref 6–20)
Bilirubin Total: 0.3 mg/dL (ref 0.0–1.2)
CO2: 23 mmol/L (ref 20–29)
Calcium: 9.2 mg/dL (ref 8.7–10.2)
Chloride: 102 mmol/L (ref 96–106)
Creatinine, Ser: 0.64 mg/dL (ref 0.57–1.00)
Globulin, Total: 2.7 g/dL (ref 1.5–4.5)
Glucose: 92 mg/dL (ref 70–99)
Potassium: 4.2 mmol/L (ref 3.5–5.2)
Sodium: 140 mmol/L (ref 134–144)
Total Protein: 6.7 g/dL (ref 6.0–8.5)
eGFR: 119 mL/min/{1.73_m2} (ref >59)

## 2022-12-05 LAB — CBC WITH DIFFERENTIAL/PLATELET
Basophils Absolute: 0 10*3/uL (ref 0.0–0.2)
Basos: 1 %
EOS (ABSOLUTE): 0.2 10*3/uL (ref 0.0–0.4)
Eos: 4 %
Hematocrit: 36 % (ref 34.0–46.6)
Hemoglobin: 11.6 g/dL (ref 11.1–15.9)
Immature Grans (Abs): 0 10*3/uL (ref 0.0–0.1)
Immature Granulocytes: 0 %
Lymphocytes Absolute: 1.6 10*3/uL (ref 0.7–3.1)
Lymphs: 28 %
MCH: 24.3 pg — ABNORMAL LOW (ref 26.6–33.0)
MCHC: 32.2 g/dL (ref 31.5–35.7)
MCV: 76 fL — ABNORMAL LOW (ref 79–97)
Monocytes Absolute: 0.6 10*3/uL (ref 0.1–0.9)
Monocytes: 10 %
Neutrophils Absolute: 3.3 10*3/uL (ref 1.4–7.0)
Neutrophils: 57 %
Platelets: 293 10*3/uL (ref 150–450)
RBC: 4.77 x10E6/uL (ref 3.77–5.28)
RDW: 14.9 % (ref 11.7–15.4)
WBC: 5.6 10*3/uL (ref 3.4–10.8)

## 2022-12-05 LAB — TSH+FREE T4
Free T4: 1.04 ng/dL (ref 0.82–1.77)
TSH: 1.51 u[IU]/mL (ref 0.450–4.500)

## 2022-12-05 LAB — LIPID PANEL
Chol/HDL Ratio: 2.8 ratio (ref 0.0–4.4)
Cholesterol, Total: 125 mg/dL (ref 100–199)
HDL: 44 mg/dL (ref >39)
LDL Chol Calc (NIH): 65 mg/dL (ref 0–99)
Triglycerides: 78 mg/dL (ref 0–149)
VLDL Cholesterol Cal: 16 mg/dL (ref 5–40)

## 2022-12-05 LAB — HEMOGLOBIN A1C
Est. average glucose Bld gHb Est-mCnc: 120 mg/dL
Hgb A1c MFr Bld: 5.8 % — ABNORMAL HIGH (ref 4.8–5.6)

## 2022-12-05 LAB — VITAMIN D 25 HYDROXY (VIT D DEFICIENCY, FRACTURES): Vit D, 25-Hydroxy: 30.3 ng/mL (ref 30.0–100.0)

## 2022-12-14 ENCOUNTER — Ambulatory Visit (INDEPENDENT_AMBULATORY_CARE_PROVIDER_SITE_OTHER): Payer: Medicaid Other | Admitting: Professional Counselor

## 2022-12-14 DIAGNOSIS — R41840 Attention and concentration deficit: Secondary | ICD-10-CM

## 2022-12-14 NOTE — BH Specialist Note (Addendum)
ADULT Comprehensive Clinical Assessment (CCA) Note   12/14/2022 SADEY DEMSKY [id number]   Referring Provider: Helmut Muster Session Start time: 1100    Session End time: 1200  Total time in minutes: 60   SUBJECTIVE: ASIJA MADERA is a 37 y.o.   female presenting for adhd.  CASHAE PAREDEZ was seen in consultation at the request of Del Nigel Berthold, FNP for evaluation of evaluation and treatment of attention deficit hyperactive disorder.  Types of Service: Collaborative care  Reason for referral in patient/family's own words:  "I need help with my ADD. I want to try medication like vivance"    Primary language at home is [place].  Constitutional Appearance: cooperative, well-nourished, well-developed, alert and well-appearing  (Patient to answer as appropriate) Gender identity: Female Sex assigned at birth: Female Pronouns: she   Mental status exam:   General Appearance Luretha Murphy:  Neat Eye Contact:  Good Motor Behavior:  Normal Speech:  Normal Level of Consciousness:  Alert Mood:   Patrient presents as  Affect:  Appropriate Anxiety Level:  Moderate Thought Process:  Disorganized Thought Content:  Appropraite Perception:  Normal Judgment:  Good Insight:  Present   Current Medications and therapies: She was prescribed wellbrutin but has not taken it since 2022 She is taking:   Outpatient Encounter Medications as of 12/14/2022  Medication Sig   amoxicillin (AMOXIL) 875 MG tablet Take 1 tablet (875 mg total) by mouth 2 (two) times daily.   amoxicillin-clavulanate (AUGMENTIN) 875-125 MG tablet Take 1 tablet by mouth 2 (two) times daily.   buPROPion (WELLBUTRIN XL) 150 MG 24 hr tablet Take 1 tablet (150 mg total) by mouth daily.   Cholecalciferol 1.25 MG (50000 UT) TABS Take 5,000 Int'l Units by mouth.   ibuprofen (ADVIL) 800 MG tablet Take 1 tablet (800 mg total) by mouth 3 (three) times daily. (Patient taking differently: Take 800 mg by mouth as needed.)   lidocaine  (XYLOCAINE) 2 % solution Use as directed 10 mLs in the mouth or throat every 3 (three) hours as needed for mouth pain.   methylphenidate 36 MG PO CR tablet Take 1 tablet (36 mg total) by mouth daily.   No facility-administered encounter medications on file as of 12/14/2022.     Therapies:  Behavioral therapy in the past. Patient saw Dr. Vanetta Shawl in 2022.  Family history:  Family mental illness:   None reported. Other relevant family history:  No known history of substance use or alcoholism  Social History:  Now living with  Husband and 4 children . Patient reports having social supports like family and friends. . Employment:  Not employed  Mood:  Patient reports generally feeling happy but does admit her ADHD causes her problems in her life and she becomes overwhelmed sometimes.   Negative Mood Concerns She does not make negative statements about self. Self-injury:  No Suicidal ideation:  No Suicide attempt:  No  Additional Anxiety Concerns: Panic attacks:  No Obsessions:  No Compulsions:  No  Stressors:  Patient reports stress related to having 4 kids to be responsible for   Alcohol and/or Substance Use: Have you recently consumed alcohol? no  Have you recently used any drugs?  no  Have you recently consumed any tobacco? no Does patient seem concerned about dependence or abuse of any substance? no  Traumatic Experiences: History or current traumatic events (natural disaster, house fire, etc.)? no History or current physical trauma?  Yes spilled hot coffee on self History or current emotional trauma?  no History or current sexual trauma?  no History or current domestic or intimate partner violence?  no History of bullying:  no  Risk Assessment: Suicidal or homicidal thoughts?   no Self injurious behaviors?  no Guns in the home?  Yes. Patient reports guns in home but are locked away safely.   Self Harm Risk Factors:  None  Self Harm Thoughts?: No  Patient and/or  Family's Strengths/Protective Factors: Social connections, Concrete supports in place (healthy food, safe environments, etc.), and Sense of purpose  Patient's and/or Family's Goals in their own words: "I just want help with my ADD".  Interventions: Interventions utilized:  Psychoeducation and/or Health Education   Patient and/or Family Response: Patient reports she feels more educated on her condition and the collaborative care program. She reports wanting to try ADD medication and is willing to accept recommendations from Psychiatry.   Standardized Assessments completed:     12/14/2022   11:37 AM 12/04/2022    8:37 AM 10/19/2020    1:28 PM  PHQ9 SCORE ONLY  PHQ-9 Total Score 9 13 0       12/14/2022   11:45 AM 12/04/2022    8:37 AM  GAD 7 : Generalized Anxiety Score  Nervous, Anxious, on Edge 0 0  Control/stop worrying 0 0  Worry too much - different things 0 1  Trouble relaxing 1 2  Restless 0 0  Easily annoyed or irritable 1 2  Afraid - awful might happen 0 0  Total GAD 7 Score 2 5  Anxiety Difficulty  Somewhat difficult      Patient Centered Plan: Patient is on the following Treatment Plan(s):  Patient will follow reccomendations to reduce symptoms of ADHD  Coordination of Care: Collaborative Care Team   DSM-5 Diagnosis: ADHD  Recommendations for Services/Supports/Treatments: Patient will follow up with PCP for medication and practice behavior inteventions.  Progress towards Goals: Ongoing  Clinical Summary: Patient was referred to counseling by her PCP for ADHD and elevated PHQ9 scores. Patient is a 37 yo female from [place]. Patient presents to counseling seeking help for her ADD symptoms. She was teartful and in distress reporting that her ADD is really impacting her day to day functioning. 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 She reports having difficulty completing necessary task and feels overwhelmed much of the time. She reports using her  phone to much and looses focus on important things. She contributes her sadness and stressors to her ADD. Otherwise she reports she is satisfied with her ilfe. She denies any Si or HI. She denies Mania. She reports previous treatment for ADD with Dr. Vanetta Shawl of [facility]. She reports Wellbutrin not working for her and wants to try a more immediate impactful medication like Vivanse. Patient received behavior techniques to help with her ADD along with Mindfulness practices to help with grounding. Patient would benefit from collaborative care.   Individual will: Utilize coping skills taught in therapy to reduce symptoms  Referral(s):  Refer to PCP for medication.  Reuel Boom

## 2022-12-14 NOTE — BH Specialist Note (Deleted)
Integrated Behavioral Health Initial In-Person Visit  MRN: [id number] Name: Faith Allen  Number of Integrated Behavioral Health Clinician visits: 1- Initial Visit  Session Start time: No data recorded   Session End time: No data recorded Total time in minutes: No data recorded  Types of Service: {CHL AMB TYPE OF SERVICE:[phone]}  Interpretor:{yes [id number]} Interpretor Name and Language: ***   Warm Hand Off Completed.        Subjective: Faith Allen is a 37 y.o. female accompanied by {CHL AMB ACCOMPANIED BJ:[id number]} Patient was referred by *** for ***. Patient reports the following symptoms/concerns: *** Duration of problem: ***; Severity of problem: {Mild/Moderate/Severe:20260}  Objective: Mood: {BHH MOOD:22306} and Affect: {BHH AFFECT:22307} Risk of harm to self or others: {CHL AMB BH Suicide Current Mental Status:21022748}  Life Context: Family and Social: *** School/Work: *** Self-Care: *** Life Changes: ***  Patient and/or Family's Strengths/Protective Factors: {CHL AMB BH PROTECTIVE FACTORS:([phone]}  Goals Addressed: Patient will: Reduce symptoms of: {IBH Symptoms:21014056} Increase knowledge and/or ability of: {IBH Patient Tools:21014057}  Demonstrate ability to: {IBH Goals:21014053}  Progress towards Goals: {CHL AMB BH PROGRESS TOWARDS GOALS:[phone]}  Interventions: Interventions utilized: {IBH Interventions:21014054}  Standardized Assessments completed: {IBH Screening Tools:21014051}  Patient and/or Family Response: ***  Patient Centered Plan: Patient is on the following Treatment Plan(s):  ***  Assessment: Patient currently experiencing ***.   Patient may benefit from ***.  Plan: Follow up with behavioral health clinician on : *** Behavioral recommendations: *** Referral(s): {IBH Referrals:21014055} "From scale of 1-10, how likely are you to follow plan?": ***  Reuel Boom

## 2022-12-14 NOTE — Patient Instructions (Addendum)
Patient will follow up with PCP for possible medication for ADD in 2 weeks.   Patient will implement mindfulness practices and behavior interventions to help with her ADD  Counselor recommends less screen time and more time outside doing engaging activities.

## 2022-12-26 ENCOUNTER — Telehealth (INDEPENDENT_AMBULATORY_CARE_PROVIDER_SITE_OTHER): Payer: Medicaid Other | Admitting: Professional Counselor

## 2022-12-26 DIAGNOSIS — F330 Major depressive disorder, recurrent, mild: Secondary | ICD-10-CM

## 2022-12-26 NOTE — BH Specialist Note (Cosign Needed Addendum)
Behavioral Health Treatment Plan Team Note  MRN: [id number] NAME: Faith Allen  DATE: 12/26/22  Start time:  4:10 End time:  4:35 Total time: 25   Total number of Virtual BH Treatment Team Plan encounters: 1/4  Treatment Team Attendees: Dr. Vanetta Shawl and Esmond Harps  Diagnoses:    ICD-10-CM   1. 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 Vanetta Shawl   Assessment/Provisional Diagnosis Faith Allen is a 37 y.o. year old female with history of depression, ADHD since child. The patient is referred for ADHD   #  history of ADHD She exhibits symptoms of ADHD. Although there is no record of neuropsychological testing, her history of childhood ADHD and previous positive response to Concerta suggest the need for further evaluation and treatment by psychiatry.    # MDD, recurrent, mild  Her symptoms have worsened while balancing caregiver duties for her children, particularly grappling with inattention. Although she may benefit from an antidepressant, her mood notably improved after addressing her ADHD symptoms when I last saw her a few years ago. Therefore, I'll defer management to the psychiatrist. The behavioral health specialist will concentrate on CBT.   Recommendation Referral to psychiatry [facility] specialist to follow up every other week.    Goals, Interventions and Follow-up Plan Goals:   Interventions: Psychoeducation and/or Health Education Medication Management Recommendations: NA Follow-up Plan:  Refer to psychiatry and continue seeing [facility] bi-weekly.   History of the present illness Presenting Problem/Current Symptoms:  Patient was referred to counseling by her PCP for ADHD and elevated PHQ9 scores. Patient is a 37 yo female from [place]. Patient presents to counseling seeking help for her ADD symptoms. She was teartful and in distress reporting that her ADD is really impacting her day to day  functioning. 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 She reports having difficulty completing necessary task and feels overwhelmed much of the time. She reports using her phone to much and looses focus on important things. She contributes her sadness and stressors to her ADD. Otherwise she reports she is satisfied with her ilfe. She denies any Si or HI. She denies Mania. She reports previous treatment for ADD with Dr. Vanetta Shawl of [facility]. She reports Wellbutrin not working for her and wants to try a more immediate impactful medication like Vivanse. Patient received behavior techniques to help with her ADD along with Mindfulness practices to help with grounding. Patient would benefit from collaborative care.   Psychiatric History  Depression: Yes Anxiety: Yes Mania: No Psychosis: No PTSD symptoms: No  Past Psychiatric History/Hospitalization(s): Hospitalization for psychiatric illness: No Prior Suicide Attempts: No Prior Self-injurious behavior: No  Psychosocial stressors  Unemployment Four children   Self-harm Behaviors Risk Assessment NA  Screenings PHQ-9 Assessments:     12/14/2022   11:37 AM 12/04/2022    8:37 AM 10/19/2020    1:28 PM  Depression screen PHQ 2/9  Decreased Interest 1 1 0  Down, Depressed, Hopeless 0 1 0  PHQ - 2 Score 1 2 0  Altered sleeping 2 1   Tired, decreased energy 1 2   Change in appetite 2 1   Feeling bad or failure about yourself  1 2   Trouble concentrating 1 3   Moving slowly or fidgety/restless 1 2   Suicidal thoughts 0 0   PHQ-9 Score 9 13   Difficult doing work/chores Very difficult Extremely dIfficult  GAD-7 Assessments:     12/14/2022   11:45 AM 12/04/2022    8:37 AM  GAD 7 : Generalized Anxiety Score  Nervous, Anxious, on Edge 0 0  Control/stop worrying 0 0  Worry too much - different things 0 1  Trouble relaxing 1 2  Restless 0 0  Easily annoyed or irritable 1 2  Afraid - awful might happen 0 0   Total GAD 7 Score 2 5  Anxiety Difficulty  Somewhat difficult    Past Medical History Past Medical History:  Diagnosis Date   Anemia    Attention deficit disorder    Medical history non-contributory     Vital signs: There were no vitals filed for this visit.  Allergies:  Allergies as of 12/26/2022   (No Known Allergies)    Medication History Current medications:  Outpatient Encounter Medications as of 12/26/2022  Medication Sig   amoxicillin (AMOXIL) 875 MG tablet Take 1 tablet (875 mg total) by mouth 2 (two) times daily.   amoxicillin-clavulanate (AUGMENTIN) 875-125 MG tablet Take 1 tablet by mouth 2 (two) times daily.   buPROPion (WELLBUTRIN XL) 150 MG 24 hr tablet Take 1 tablet (150 mg total) by mouth daily.   Cholecalciferol 1.25 MG (50000 UT) TABS Take 5,000 Int'l Units by mouth.   ibuprofen (ADVIL) 800 MG tablet Take 1 tablet (800 mg total) by mouth 3 (three) times daily. (Patient taking differently: Take 800 mg by mouth as needed.)   lidocaine (XYLOCAINE) 2 % solution Use as directed 10 mLs in the mouth or throat every 3 (three) hours as needed for mouth pain.   methylphenidate 36 MG PO CR tablet Take 1 tablet (36 mg total) by mouth daily.   No facility-administered encounter medications on file as of 12/26/2022.     Scribe for Treatment Team: Reuel Boom

## 2022-12-27 ENCOUNTER — Ambulatory Visit: Payer: Medicaid Other | Admitting: Professional Counselor

## 2022-12-27 DIAGNOSIS — F330 Major depressive disorder, recurrent, mild: Secondary | ICD-10-CM

## 2022-12-27 NOTE — Patient Instructions (Signed)
Practice guided meditation   Continue with CBT education   Continue with behavior modification with ADHD tip sheet

## 2022-12-27 NOTE — BH Specialist Note (Addendum)
Integrated Behavioral Health Follow Up In-Person Visit  MRN: [id number] Name: Faith Allen  Number of Integrated Behavioral Health Clinician visits: 2- Second Visit  Session Start time: 0900   Session End time: 0930  Total time in minutes: 30   Types of Service: Collaborative care   Subjective: 37 year old female presents for follow-up visit in collaborative care for depression and ADHD. Patient reports overall improvement in mood and functioning, attributing progress to homework assignments, the weather changing and  previous session with behavioral counselor. She reports feeling less overwhelmed and more supported by husband. Patient reports increase in self-care activities and social connections. Patient also reports added stress due to caring for niece while sister is hospitalized.  Objective: Patient presented with good eye contact, bright affect and mood congruent with topics discussed.   Assessment: Patient demonstrates improvement in mood and functioning, with ongoing symptoms of depression and ADHD. 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 She will benefit from psychiatric evaluation to determine appropriate treatment approach of medication.  Plan:  1.Recommend a psychiatric evaluation to determine the appropriate treatment approach for the patient's symptoms, as suggested by the consultant psychiatrist. 2.Continue therapeutic work with the behavioral counselor, focusing on cognitive-behavioral techniques to address ADHD symptoms. 3.Schedule a follow-up appointment in two weeks to reassess symptoms and progress with the behavioral counselor   Goals Addressed: Patient will:  Reduce symptoms of: Depression and ADHD  Increase knowledge and/or ability of: self-management skills   Demonstrate ability to:  Increase healthy adjustment to current life circumstances  Progress towards Goals: Ongoing      12/27/2022    9:17 AM 12/14/2022   11:37 AM 12/04/2022    8:37 AM  PHQ9 SCORE ONLY  PHQ-9 Total Score 9 9 13        12/27/2022    9:24 AM 12/14/2022   11:45 AM 12/04/2022    8:37 AM  GAD 7 : Generalized Anxiety Score  Nervous, Anxious, on Edge 0 0 0  Control/stop worrying 0 0 0  Worry too much - different things 1 0 1  Trouble relaxing 1 1 2   Restless 0 0 0  Easily annoyed or irritable 1 1 2   Afraid - awful might happen 0 0 0  Total GAD 7 Score 3 2 5   Anxiety Difficulty Somewhat difficult  Somewhat difficult   Interventions: Interventions utilized:  CBT Cognitive Behavioral Therapy Standardized Assessments completed:   Patient and/or Family Response: Patient is demonstrating the knowledge learned in previous sessions by educating herself on CBT approaches, mindfulness and behavior modification.  Patient Centered Plan: Patient is on the following Treatment Plan(s): Collaborative Care Follow up with behavioral health clinician on : 2 weeks Behavioral recommendations: Continue with plan Referral(s): Psychiatrist   Reuel Boom

## 2022-12-28 ENCOUNTER — Ambulatory Visit: Payer: Medicaid Other | Admitting: Family Medicine

## 2022-12-28 ENCOUNTER — Encounter: Payer: Self-pay | Admitting: Family Medicine

## 2022-12-28 VITALS — BP 100/59 | HR 78 | Resp 16 | Ht 60.0 in | Wt 156.0 lb

## 2022-12-28 DIAGNOSIS — F908 Attention-deficit hyperactivity disorder, other type: Secondary | ICD-10-CM | POA: Diagnosis not present

## 2022-12-28 DIAGNOSIS — R41840 Attention and concentration deficit: Secondary | ICD-10-CM

## 2022-12-28 MED ORDER — NORTRIPTYLINE HCL 10 MG PO CAPS
10.0000 mg | ORAL_CAPSULE | Freq: Every day | ORAL | 3 refills | Status: AC
Start: 2022-12-28 — End: ?

## 2022-12-28 NOTE — Assessment & Plan Note (Addendum)
Trial on Nortiptyline 10 mg daily Continued discussion on lifestyle changes, establishing a daily routine, going outdoors, exercise, healthy eating habits, mindfulness and mediatation.  Follow up in 6 weeks

## 2022-12-28 NOTE — Patient Instructions (Signed)
        Great to see you today.   - Please take medications as prescribed. - Follow up with your primary health provider if any health concerns arises. - If symptoms worsen please contact your primary care provider and/or visit the emergency department.  

## 2022-12-28 NOTE — Progress Notes (Signed)
Patient Office Visit   Subjective   [name], female    DOB: [birth year]  Age: 37 y.o. MRN: [id number]  CC:  Chief Complaint  Patient presents with   ADHD    Follow up     HPI Faith Allen 37 year old female, presents to the clinic for ADHD follow up. She  has a past medical history of Anemia, Attention deficit disorder, and Medical history non-contributory.For the details of today's visit, please refer to assessment and plan.   HPI  Outpatient Encounter Medications as of 12/28/2022  Medication Sig   nortriptyline (PAMELOR) 10 MG capsule Take 1 capsule (10 mg total) by mouth at bedtime.   [DISCONTINUED] ibuprofen (ADVIL) 800 MG tablet Take 1 tablet (800 mg total) by mouth 3 (three) times daily. (Patient taking differently: Take 800 mg by mouth as needed.)   [DISCONTINUED] lidocaine (XYLOCAINE) 2 % solution Use as directed 10 mLs in the mouth or throat every 3 (three) hours as needed for mouth pain.   [DISCONTINUED] methylphenidate 36 MG PO CR tablet Take 1 tablet (36 mg total) by mouth daily.   amoxicillin (AMOXIL) 875 MG tablet Take 1 tablet (875 mg total) by mouth 2 (two) times daily. (Patient not taking: Reported on 12/28/2022)   amoxicillin-clavulanate (AUGMENTIN) 875-125 MG tablet Take 1 tablet by mouth 2 (two) times daily. (Patient not taking: Reported on 12/28/2022)   buPROPion (WELLBUTRIN XL) 150 MG 24 hr tablet Take 1 tablet (150 mg total) by mouth daily. (Patient not taking: Reported on 12/28/2022)   Cholecalciferol 1.25 MG (50000 UT) TABS Take 5,000 Int'l Units by mouth. (Patient not taking: Reported on 12/28/2022)   No facility-administered encounter medications on file as of 12/28/2022.    Past Surgical History:  Procedure Laterality Date   NO PAST SURGERIES     TUBAL LIGATION Bilateral 10/07/2016   Procedure: POST PARTUM TUBAL LIGATION;  Surgeon: Lazaro Arms, MD;  Location: [facility];  Service: Gynecology;  Laterality: Bilateral;    Review of Systems   Constitutional:  Negative for chills and fever.  Respiratory:  Negative for shortness of breath.   Cardiovascular:  Negative for chest pain.  Gastrointestinal:  Negative for abdominal pain.  Genitourinary:  Negative for dysuria.  Musculoskeletal:  Negative for myalgias.  Neurological:  Negative for dizziness and headaches.      Objective    BP (!) 100/59   Pulse 78   Resp 16   Ht 5' (1.524 m)   Wt 156 lb (70.8 kg)   SpO2 95%   BMI 30.47 kg/m   Physical Exam Vitals reviewed.  Constitutional:      General: She is not in acute distress.    Appearance: Normal appearance. She is not ill-appearing, toxic-appearing or diaphoretic.  HENT:     Head: Normocephalic.  Eyes:     General:        Right eye: No discharge.        Left eye: No discharge.     Conjunctiva/sclera: Conjunctivae normal.  Cardiovascular:     Rate and Rhythm: Normal rate.     Pulses: Normal pulses.     Heart sounds: Normal heart sounds.  Pulmonary:     Effort: Pulmonary effort is normal. No respiratory distress.     Breath sounds: Normal breath sounds.  Musculoskeletal:        General: Normal range of motion.     Cervical back: Normal range of motion.  Skin:  General: Skin is warm and dry.     Capillary Refill: Capillary refill takes less than 2 seconds.  Neurological:     General: No focal deficit present.     Mental Status: She is alert and oriented to person, place, and time.     Coordination: Coordination normal.     Gait: Gait normal.  Psychiatric:        Mood and Affect: Mood normal.        Behavior: Behavior normal.       Assessment & Plan:  Attention deficit hyperactivity disorder (ADHD), other type -     Nortriptyline HCl; Take 1 capsule (10 mg total) by mouth at bedtime.  Dispense: 30 capsule; Refill: 3  Attention and concentration deficit Assessment & Plan: Trial on Nortiptyline 10 mg daily Continued discussion on lifestyle changes, establishing a daily routine, going outdoors,  exercise, healthy eating habits, mindfulness and mediatation.  Follow up in 6 weeks     Return in about 6 weeks (around 02/08/2023) for ADHD medication managment .   [name] Newman Nip, FNP

## 2023-01-07 NOTE — Progress Notes (Unsigned)
BH MD/PA/NP OP Progress Note  01/08/2023 11:59 AM Faith Allen  MRN:  [id number]  Chief Complaint:  Chief Complaint  Patient presents with   Establish Care   HPI:  - she is not seen since 10/2020.  This is a follow-up appointment for ADHD, depression.  She states that she has been working on her end to help ADHD symptoms. She has been trying CBT technique, taking vitamins, which helped her to have some clarity in her mind.  She also has found it very helpful to eat less sugar in the morning.  She does not have craving for sweets as she did in the past.  However, she tends to be distracted by random thoughts and noise.  She tends to be distracted when she is talking with others.  She forgets what she is saying during the conversation.  She was distracted on the way to this appointment by a sign, she tearfully describes that there is disconnection between knowing what she has to do and be able to do those.  She has issues with concept of time.  She always gets 10 minutes late.  It has been very difficult that she take care of her 4 kids.  She brings a child to baseball and bring another to other place.  It requires mental energy.  She states that she is only 37 year old, and has been forgetful, which has worsened over the past few years.  She is unable to remember the memory with her husband such as going to places together.  Her mother informed her that she was on medication for ADHD when she was in elementary school.  She states that she did not continue to follow-up with this Clinical research associate as she was discouraged that time. She felt that concerta was like a cup of coffee without jitteriness without any change in her symptoms.  She thought that she may not need a professional help that time, and had tried to work on herself.  However, she now thinks she needs a help, and would like to try medication. She searched on the website and wondered whether she can try Adderall in the future.  Provided psychoeducation that  while Adderall IR is not recommended, we may consider Adderall XR in the future if the plan below does not work.   Depression-she does not think she is depressed.  She describes herself as being optimistic.  However, she tends to tearful easily lately, which is unusual for her as she has never been an emotional person ("testosterone type of girl.")  She states that although she may feel depressed subconsciously, she is not aware of this.  She took only 1 dose of nortriptyline as she found out it is antidepressant and that ADHD. She denies SI.   Marital issues-she states that her husband abused alcohol, and has been to [employer].  She also found out that he is watching pornography.  It was not okay with her, and is currently on permanent control app. She gets screen shots of his cell phone, and feels better with this.  Although she was drinking a little heavier during that time, she has not any for the past few months.    Substance use  Tobacco Alcohol Other substances/  Current denies Quit in Jan 2024 (used to drink a glass of wine a few times per week) denies  Past denies Maximum-3 glasses of wine at dinner Marijuana BID until 20's, quit in 2022  Past Treatment  Wt Readings from Last 3 Encounters:  01/08/23 150 lb 12.8 oz (68.4 kg)  12/28/22 156 lb (70.8 kg)  12/04/22 151 lb (68.5 kg)     Daily routine: spends time with her husband and children in the morning. She goes to park or visits her father until her husband return from work Employment: works at [employer]: husband and four children Marital status: married since 1990 Number of children: 4, age 85, 22, 24, 3 Education: one year college of Early childhood education. She received certificate, She has GED; she stopped going to school at 12 th grade. Although she does not remember the reason, she states that she wanted to hang out with her friends instead.  She moved to [place] at age 59  Visit Diagnosis:    ICD-10-CM   1.  Attention deficit hyperactivity disorder (ADHD), predominantly inattentive type  F90.0 Urine drugs of abuse scrn w alc, routine (Ref Lab)    2. Major depressive disorder with single episode, in full remission (HCC)  F32.5       Past Psychiatric History: Please see initial evaluation for full details. I have reviewed the history. No updates at this time.     Past Medical History:  Past Medical History:  Diagnosis Date   Anemia    Attention deficit disorder    Medical history non-contributory     Past Surgical History:  Procedure Laterality Date   NO PAST SURGERIES     TUBAL LIGATION Bilateral 10/07/2016   Procedure: POST PARTUM TUBAL LIGATION;  Surgeon: Lazaro Arms, MD;  Location: [facility];  Service: Gynecology;  Laterality: Bilateral;    Family Psychiatric History: Please see initial evaluation for full details. I have reviewed the history. No updates at this time.     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  Tobacco Use   Smoking status: Former    Years: 1    Types: Cigarettes    Quit date: 07/05/2007    Years since quitting: 15.5   Smokeless tobacco: Never   Tobacco comments:    Would smoke socially  Vaping Use   Vaping Use: Former  Substance and Sexual Activity   Alcohol use: Yes    Alcohol/week: 3.0 standard drinks of alcohol    Types: 2 Glasses of wine, 1 Shots of liquor per week    Comment: Once a month   Drug use: No   Sexual  activity: Not Currently    Birth control/protection: Surgical    Comment: tubal  Other Topics Concern   Not on file  Social History Narrative   Not on file   Social Determinants of Health   Financial Resource Strain: Not on file  Food Insecurity: Not on file  Transportation Needs: Not on file  Physical Activity: Not on file  Stress: Not on file  Social Connections: Not on  file    Allergies: No Known Allergies  Metabolic Disorder Labs: Lab Results  Component Value Date   HGBA1C 5.8 (H) 12/04/2022   MPG 108 08/03/2019   No results found for: "PROLACTIN" Lab Results  Component Value Date   CHOL 125 12/04/2022   TRIG 78 12/04/2022   HDL 44 12/04/2022   CHOLHDL 2.8 12/04/2022   LDLCALC 65 12/04/2022   LDLCALC 80 08/03/2019   Lab Results  Component Value Date   TSH 1.510 12/04/2022   TSH 0.89 07/27/2020    Therapeutic Level Labs: No results found for: "LITHIUM" No results found for: "VALPROATE" No results found for: "CBMZ"  Current Medications: Current Outpatient Medications  Medication Sig Dispense Refill   Cholecalciferol 1.25 MG (50000 UT) TABS Take 5,000 Int'l Units by mouth.     Probiotic Product (FORTIFY PROBIOTIC WOMENS PO) Take by mouth.     amoxicillin (AMOXIL) 875 MG tablet Take 1 tablet (875 mg total) by mouth 2 (two) times daily. (Patient not taking: Reported on 01/08/2023) 20 tablet 0   amoxicillin-clavulanate (AUGMENTIN) 875-125 MG tablet Take 1 tablet by mouth 2 (two) times daily. (Patient not taking: Reported on 01/08/2023) 20 tablet 0   nortriptyline (PAMELOR) 10 MG capsule Take 1 capsule (10 mg total) by mouth at bedtime. (Patient not taking: Reported on 01/08/2023) 30 capsule 3   No current facility-administered medications for this visit.     Musculoskeletal: Strength & Muscle Tone: within normal limits Gait & Station: normal Patient leans: N/A  Psychiatric Specialty Exam: Review of Systems  Psychiatric/Behavioral:  Positive for decreased  concentration. Negative for agitation, behavioral problems, confusion, dysphoric mood, hallucinations, self-injury, sleep disturbance and suicidal ideas. The patient is not nervous/anxious and is not hyperactive.   All other systems reviewed and are negative.   Blood pressure 113/73, pulse 61, temperature (!) 97.4 F (36.3 C), temperature source Skin, height 5' (1.524 m), weight 150 lb 12.8 oz (68.4 kg).Body mass index is 29.45 kg/m.  General Appearance: Well Groomed  Eye Contact:  Good  Speech:  Clear and Coherent  Volume:  Normal  Mood:   good  Affect:  Appropriate, Congruent, and Tearful  Thought Process:  Coherent  Orientation:  Full (Time, Place, and Person)  Thought Content: Logical   Suicidal Thoughts:  No  Homicidal Thoughts:  No  Memory:  Immediate;   Good  Judgement:  Good  Insight:  Good  Psychomotor Activity:  Normal  Concentration:  Concentration: Good and Attention Span: Good  Recall:  Good  Fund of Knowledge: Good  Language: Good  Akathisia:  No  Handed:  Right  AIMS (if indicated): not done  Assets:  Communication Skills Desire for Improvement  ADL's:  Intact  Cognition: WNL  Sleep:  Fair   Screenings: GAD-7    Psychologist, occupational Health from 12/27/2022 in [facility] Integrated Behavioral Health from 12/14/2022 in [facility] Office Visit from 12/04/2022 in [facility]  Total GAD-7 Score 3 2 5       PHQ2-9    Flowsheet Row Integrated Behavioral Health from 12/27/2022 in [facility] Integrated Behavioral Health from 12/14/2022 in [facility] Office Visit from 12/04/2022 in [facility] Video Visit from 10/19/2020 in [facility] Office Visit from 08/03/2019 in [facility]  PHQ-2 Total Score 1 1 2  0 2  PHQ-9 Total Score 9 9 13  -- 14  Flowsheet Row ED  from 10/14/2021 in [facility] Video Visit from 10/19/2020 in [facility]  C-SSRS RISK CATEGORY No Risk No Risk        Assessment and Plan:  CHASYA HEO is a 37 y.o. year old female with a history of  ADHD, depression, who presents for follow up appointment for below.   1. Attention deficit hyperactivity disorder (ADHD), predominantly inattentive type -She was on ADHD medication when she was in elementary school.  She struggles with ADHD symptoms for the past few years, and it has been affecting her functioning. Although formal neuropsychological testing has not been conducted, she does have a history of childhood ADHD, and the clinical course is consistent with ADHD. Given her preference of not trying Concerta anymore, will try Focalin XR after obtaining UDS.  Discussed potential risks, which include but not limited to palpitation, worsening in anxiety, insomnia, headache.   2. Major depressive disorder with single episode, in full remission [facility]) Although she reports occasional tearfulness, and she was tearful during the visit while discussing her ADHD symptoms, she denies feeling depressed, and has been able to be attentive to her children.  Will continue to assess and treat accordingly.  Of note, she has not taken nortriptyline prescribed by her PCP.  Will hold this medication at this time.    Plan Obtain UDS After reviewing UDS, will plan to order Focalin XR 5 mg daily  Hold nortriptyline Next appointment- 6/21 at 9 am for 30 mins, video Father cell phone-  [phone], **[email or web address]    The patient demonstrates the following risk factors for suicide: Chronic risk factors for suicide include: psychiatric disorder of none. Acute risk factors for suicide include: unemployment. Protective factors for this patient include: responsibility to others (children, family) and hope for the future. Considering these factors,  the overall suicide risk at this point appears to be low. Patient is appropriate for outpatient follow up.  Collaboration of Care: Collaboration of Care: Other reviewed notes in Epic  Patient/Guardian was advised Release of Information must be obtained prior to any record release in order to collaborate their care with an outside provider. Patient/Guardian was advised if they have not already done so to contact the registration department to sign all necessary forms in order for [place] to release information regarding their care.   Consent: Patient/Guardian gives verbal consent for treatment and assignment of benefits for services provided during this visit. Patient/Guardian expressed understanding and agreed to proceed.   The duration of the time spent on the following activities on the date of the encounter was 50 minutes.   Preparing to see the patient (e.g., review of test, records)  Obtaining and/or reviewing separately obtained history  Performing a medically necessary exam and/or evaluation  Counseling and educating the patient/family/caregiver  Ordering medications, tests, or procedures  Referring and communicating with other healthcare professionals (when not reported separately)  Documenting clinical information in the electronic or paper health record  Independently interpreting results of tests/labs and communication of results to the family or caregiver  Care coordination (when not reported separately)   Neysa Hotter, MD 01/08/2023, 11:59 AM

## 2023-01-08 ENCOUNTER — Encounter: Payer: Self-pay | Admitting: Psychiatry

## 2023-01-08 ENCOUNTER — Ambulatory Visit (INDEPENDENT_AMBULATORY_CARE_PROVIDER_SITE_OTHER): Payer: No Typology Code available for payment source | Admitting: Psychiatry

## 2023-01-08 ENCOUNTER — Other Ambulatory Visit
Admission: RE | Admit: 2023-01-08 | Discharge: 2023-01-08 | Disposition: A | Discharge status: Home / Self Care | Payer: No Typology Code available for payment source | Attending: Psychiatry | Admitting: Psychiatry

## 2023-01-08 VITALS — BP 113/73 | HR 61 | Temp 97.4°F | Ht 60.0 in | Wt 150.8 lb

## 2023-01-08 DIAGNOSIS — F325 Major depressive disorder, single episode, in full remission: Secondary | ICD-10-CM | POA: Diagnosis not present

## 2023-01-08 DIAGNOSIS — F900 Attention-deficit hyperactivity disorder, predominantly inattentive type: Secondary | ICD-10-CM | POA: Diagnosis present

## 2023-01-09 ENCOUNTER — Other Ambulatory Visit: Payer: Self-pay | Admitting: Psychiatry

## 2023-01-09 LAB — URINE DRUGS OF ABUSE SCREEN W ALC, ROUTINE (REF LAB)
Amphetamines, Urine: NEGATIVE ng/mL
Barbiturate, Ur: NEGATIVE ng/mL
Benzodiazepine Quant, Ur: NEGATIVE ng/mL
Cannabinoid Quant, Ur: NEGATIVE ng/mL
Cocaine (Metab.): NEGATIVE ng/mL
Ethanol U, Quan: NEGATIVE %
Methadone Screen, Urine: NEGATIVE ng/mL
Opiate Quant, Ur: NEGATIVE ng/mL
Phencyclidine, Ur: NEGATIVE ng/mL
Propoxyphene, Urine: NEGATIVE ng/mL

## 2023-01-09 MED ORDER — DEXMETHYLPHENIDATE HCL ER 5 MG PO CP24: 5.0000 mg | ORAL_CAPSULE | ORAL | 0 refills | Status: DC

## 2023-01-09 NOTE — Progress Notes (Signed)
The UDS is negative. I've ordered Focalin 5 mg XR to the pharmacy as we discussed during her last visit. Could you inform the patient about this? Thanks.

## 2023-01-10 ENCOUNTER — Ambulatory Visit: Payer: Medicaid Other | Admitting: Professional Counselor

## 2023-01-10 NOTE — Progress Notes (Signed)
Informed patient of lab results and made aware that the Focalin had been sent to the pharmacy

## 2023-01-14 ENCOUNTER — Telehealth: Payer: Self-pay

## 2023-01-14 NOTE — Telephone Encounter (Signed)
-----   Message from Neysa Hotter, MD sent at 01/09/2023  4:56 PM EDT ----- The UDS is negative. I've ordered Focalin 5 mg XR to the pharmacy as we discussed during her last visit. Could you inform the patient about this? Thanks.

## 2023-01-14 NOTE — Telephone Encounter (Signed)
Pt.notified

## 2023-02-08 ENCOUNTER — Ambulatory Visit: Payer: Medicaid Other | Admitting: Family Medicine

## 2023-02-08 ENCOUNTER — Encounter: Payer: Self-pay | Admitting: Family Medicine

## 2023-02-08 VITALS — BP 100/65 | HR 67 | Ht 60.0 in | Wt 149.0 lb

## 2023-02-08 DIAGNOSIS — R41840 Attention and concentration deficit: Secondary | ICD-10-CM

## 2023-02-08 DIAGNOSIS — Z0100 Encounter for examination of eyes and vision without abnormal findings: Secondary | ICD-10-CM

## 2023-02-08 NOTE — Assessment & Plan Note (Signed)
Patient reported not taking nortriptyline 10 mg. 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

## 2023-02-08 NOTE — Patient Instructions (Signed)
        Great to see you today.   - Please take medications as prescribed. - Follow up with your primary health provider if any health concerns arises. - If symptoms worsen please contact your primary care provider and/or visit the emergency department.  

## 2023-02-08 NOTE — Progress Notes (Signed)
Patient Office Visit   Subjective   [name], female    DOB: 10/11/1985  Age: 37 y.o. MRN: [id number]  CC:  Chief Complaint  Patient presents with   ADHD    Patient is here for ADHD f/u. States she is not taking focalin.     HPI Faith Allen 37 year old female presents to the clinic for ADHD follow up.She  has a past medical history of Anemia, Attention deficit disorder, and Medical history non-contributory.For the details of today's visit, please refer to assessment and plan.   HPI    Outpatient Encounter Medications as of 02/08/2023  Medication Sig   Cholecalciferol 1.25 MG (50000 UT) TABS Take 5,000 Int'l Units by mouth.   amoxicillin (AMOXIL) 875 MG tablet Take 1 tablet (875 mg total) by mouth 2 (two) times daily.   amoxicillin-clavulanate (AUGMENTIN) 875-125 MG tablet Take 1 tablet by mouth 2 (two) times daily.   dexmethylphenidate (FOCALIN XR) 5 MG 24 hr capsule Take 1 capsule (5 mg total) by mouth every morning. (Patient not taking: Reported on 02/08/2023)   dexmethylphenidate (FOCALIN XR) 5 MG 24 hr capsule Take 1 capsule (5 mg total) by mouth every morning. (Patient not taking: Reported on 02/08/2023)   nortriptyline (PAMELOR) 10 MG capsule Take 1 capsule (10 mg total) by mouth at bedtime.   Probiotic Product (FORTIFY PROBIOTIC WOMENS PO) Take by mouth. (Patient not taking: Reported on 02/08/2023)   No facility-administered encounter medications on file as of 02/08/2023.    Past Surgical History:  Procedure Laterality Date   NO PAST SURGERIES     TUBAL LIGATION Bilateral 10/07/2016   Procedure: POST PARTUM TUBAL LIGATION;  Surgeon: Lazaro Arms, MD;  Location: [facility];  Service: Gynecology;  Laterality: Bilateral;    Review of Systems  Constitutional:  Negative for chills and fever.  Eyes:  Negative for blurred vision.  Respiratory:  Negative for shortness of breath.   Cardiovascular:  Negative for chest pain.  Gastrointestinal:  Negative for abdominal  pain.  Genitourinary:  Negative for dysuria.  Musculoskeletal:  Negative for myalgias.  Neurological:  Negative for dizziness and headaches.      Objective    BP 100/65   Pulse 67   Ht 5' (1.524 m)   Wt 149 lb (67.6 kg)   SpO2 97%   BMI 29.10 kg/m   Physical Exam Vitals reviewed.  Constitutional:      General: She is not in acute distress.    Appearance: Normal appearance. She is not ill-appearing, toxic-appearing or diaphoretic.  HENT:     Head: Normocephalic.  Eyes:     General:        Right eye: No discharge.        Left eye: No discharge.     Conjunctiva/sclera: Conjunctivae normal.  Cardiovascular:     Rate and Rhythm: Normal rate.     Pulses: Normal pulses.     Heart sounds: Normal heart sounds.  Pulmonary:     Effort: Pulmonary effort is normal. No respiratory distress.     Breath sounds: Normal breath sounds.  Musculoskeletal:        General: Normal range of motion.     Cervical back: Normal range of motion.  Skin:    General: Skin is warm and dry.     Capillary Refill: Capillary refill takes less than 2 seconds.  Neurological:     General: No focal deficit present.     Mental Status:  She is alert and oriented to person, place, and time.     Coordination: Coordination normal.     Gait: Gait normal.  Psychiatric:        Mood and Affect: Mood normal.        Behavior: Behavior normal.       Assessment & Plan:  Encounter for complete eye exam -     Ambulatory referral to Ophthalmology  Attention and concentration deficit Assessment & Plan: Patient reported not taking nortriptyline 10 mg. 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     Return in about 6 months (around 08/10/2023), or if symptoms worsen or fail to improve, for routine labs, pre-diabetes .   Faith Allen  Newman Nip, FNP

## 2023-02-17 NOTE — Progress Notes (Signed)
Virtual Visit via Video Note  I connected with Faith Allen on 02/22/23 at  9:00 AM EDT by a video enabled telemedicine application and verified that I am speaking with the correct person using two identifiers.  Location: Patient: car Provider: office Persons participated in the visit- patient, provider    I discussed the limitations of evaluation and management by telemedicine and the availability of in person appointments. The patient expressed understanding and agreed to proceed.    I discussed the assessment and treatment plan with the patient. The patient was provided an opportunity to ask questions and all were answered. The patient agreed with the plan and demonstrated an understanding of the instructions.   The patient was advised to call back or seek an in-person evaluation if the symptoms worsen or if the condition fails to improve as anticipated.  I provided 15 minutes of non-face-to-face time during this encounter.   Neysa Hotter, MD    [facility] MD/PA/NP OP Progress Note  02/22/2023 9:34 AM Faith Allen  MRN:  [id number]  Chief Complaint:  Chief Complaint  Patient presents with   Follow-up   HPI:  This is a follow-up appointment for ADHD.  She states that she has been doing pretty good.  She believes the medication helped a lot especially for the first few weeks.  She was not over eating or craving for food.  She has not felt overwhelmed with things.  Although she has been busy as she needs to bring her children to summer camp, she has been able to handle things well most of the time.  She is more motivated, and not procrastinating things.  She does not over fixate on things such as art project anymore.  She talks about an example of her not needing to eat much ice cream anymore.  She did not feel the need to overcompensate.  She reports good relationship with her husband who is in 2 years of sobriety.  He respects her mood, and they communicate better.  She denies feeling  depressed, and feels more hopeful. She denies insomnia.  She denies SI.  She is willing to try higher dose of Focalin.   Substance use   Tobacco Alcohol Other substances/  Current denies Quit in Jan 2024 (used to drink a glass of wine a few times per week) denies  Past denies Maximum-3 glasses of wine at dinner Marijuana BID until 20's, quit in 2022  Past Treatment           Daily routine: spends time with her husband and children in the morning. She goes to park or visits her father until her husband return from work Employment: works at [employer]: husband and four children Marital status: married since 1990 Number of children: 4, age 58, 35, 37, 3 Education: one year college of Early childhood education. She received certificate, She has GED; she stopped going to school at 12 th grade. Although she does not remember the reason, she states that she wanted to hang out with her friends instead.  She moved to [place] at age 58  Visit Diagnosis:    ICD-10-CM   1. Attention deficit hyperactivity disorder (ADHD), predominantly inattentive type  F90.0       Past Psychiatric History: Please see initial evaluation for full details. I have reviewed the history. No updates at this time.     Past Medical History:  Past Medical History:  Diagnosis Date   Anemia    Attention deficit disorder  Medical history non-contributory     Past Surgical History:  Procedure Laterality Date   NO PAST SURGERIES     TUBAL LIGATION Bilateral 10/07/2016   Procedure: POST PARTUM TUBAL LIGATION;  Surgeon: Lazaro Arms, MD;  Location: [facility];  Service: Gynecology;  Laterality: Bilateral;    Family Psychiatric History: Please see initial evaluation for full details. I have reviewed the history. No updates at this time.     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  Tobacco Use   Smoking status: Former    Years: 1    Types: Cigarettes    Quit date: 07/05/2007    Years since quitting: 15.6   Smokeless tobacco: Never   Tobacco comments:    Would smoke socially  Vaping Use   Vaping Use: Former  Substance and Sexual Activity   Alcohol use: Yes    Alcohol/week: 3.0 standard drinks of alcohol    Types: 2 Glasses of wine, 1 Shots of liquor per week    Comment: Once a month   Drug use: No   Sexual activity: Not Currently    Birth control/protection: Surgical    Comment: tubal  Other Topics Concern   Not on file  Social History Narrative   Not on file   Social Determinants of Health   Financial Resource Strain: Not on file  Food Insecurity: Not on file  Transportation Needs: Not on file  Physical Activity: Not on file  Stress: Not on file  Social Connections: Not on file    Allergies: No Known Allergies  Metabolic Disorder Labs: Lab Results  Component Value Date   HGBA1C 5.8 (H) 12/04/2022   MPG 108 08/03/2019   No results found for: "PROLACTIN" Lab Results  Component Value Date   CHOL 125 12/04/2022   TRIG 78 12/04/2022   HDL 44 12/04/2022   CHOLHDL 2.8 12/04/2022   LDLCALC 65 12/04/2022   LDLCALC 80 08/03/2019   Lab Results  Component Value Date   TSH 1.510 12/04/2022   TSH 0.89 07/27/2020    Therapeutic Level Labs: No results found for: "LITHIUM" No results found for: "VALPROATE" No results found for: "CBMZ"  Current Medications: Current Outpatient Medications  Medication Sig Dispense Refill   dexmethylphenidate  (FOCALIN XR) 10 MG 24 hr capsule Take 1 capsule (10 mg total) by mouth every morning. 30 capsule 0   [START ON 03/24/2023] dexmethylphenidate (FOCALIN XR) 10 MG 24 hr capsule Take 1 capsule (10 mg total) by mouth every morning. 30 capsule 0   Cholecalciferol 1.25 MG (50000 UT) TABS Take 5,000 Int'l Units by mouth.     nortriptyline (PAMELOR) 10 MG capsule Take 1 capsule (10 mg total) by mouth at bedtime. (Patient not taking: Reported on 02/22/2023) 30 capsule 3   Probiotic Product (FORTIFY PROBIOTIC WOMENS PO) Take by mouth. (Patient not taking:  Reported on 02/08/2023)     triamcinolone (KENALOG) 0.025 % ointment Apply 1 Application topically 2 (two) times daily. Apply sparingly to skin up to 2 times daily as needed and leave open to air 15 g 0   No current facility-administered medications for this visit.     Musculoskeletal: Strength & Muscle Tone:  N/A Gait & Station:  N/A Patient leans: N/A  Psychiatric Specialty Exam: Review of Systems  Psychiatric/Behavioral:  Positive for decreased concentration. Negative for agitation, behavioral problems, confusion, dysphoric mood, hallucinations, self-injury, sleep disturbance and suicidal ideas. The patient is not nervous/anxious and is not hyperactive.   All other systems reviewed and are negative.   Last menstrual period 02/12/2023.There is no height or weight on file to calculate BMI.  General Appearance: Fairly Groomed  Eye Contact:  Good  Speech:  Clear and Coherent  Volume:  Normal  Mood:   good  Affect:  Appropriate, Congruent, and Full Range  Thought Process:  Coherent  Orientation:  Full (Time, Place, and Person)  Thought Content: Logical   Suicidal Thoughts:  No  Homicidal Thoughts:  No  Memory:  Immediate;   Good  Judgement:  Good  Insight:  Good  Psychomotor Activity:  Normal  Concentration:  Concentration: Good and Attention Span: Good  Recall:  Good  Fund of Knowledge: Good  Language: Good  Akathisia:  No  Handed:  Right   AIMS (if indicated): not done  Assets:  Communication Skills Desire for Improvement  ADL's:  Intact  Cognition: WNL  Sleep:  Good   Screenings: GAD-7    Flowsheet Row Office Visit from 02/08/2023 in [facility] from 12/27/2022 in [facility] from 12/14/2022 in [facility] Office Visit from 12/04/2022 in [facility]  Total GAD-7 Score 3 3 2 5       PHQ2-9    Flowsheet Row Office Visit from 02/08/2023 in [facility] from 12/27/2022 in [facility] from 12/14/2022 in [facility] Office Visit from 12/04/2022 in [facility] Video Visit from 10/19/2020 in [facility]  PHQ-2 Total Score 1 1 1 2  0  PHQ-9 Total Score 3 9 9 13  --      Flowsheet Row ED from 02/18/2023 in [facility] ED from 10/14/2021 in [facility] [place] Video Visit from 10/19/2020 in [facility]  C-SSRS RISK CATEGORY No Risk No Risk No Risk        Assessment and Plan:  Faith Allen is a 37 y.o. year old female with a history of  ADHD, depression, who presents for follow up appointment for below.   1. Attention deficit hyperactivity disorder (ADHD), predominantly inattentive type -She was on ADHD medication when she was in elementary school. UDS negative 01/2023  Significantly improvement in procrastination, completing tasks and multitasking since starting Focalin.  We do further uptitration to optimize treatment for ADHD. Although formal neuropsychological testing has not been conducted, she does have a history of childhood ADHD, and the clinical course is consistent with ADHD.   2. Major depressive disorder  with single episode, in full remission (HCC) Significant improvement in her mood symptoms since starting Focalin/improvement in her function.  Will continue to assess as needed.    Plan Increase  Focalin XR 10 mg daily  Next appointment- 8/14 at 8 20 for 20 mins, video Father cell phone-  [phone], **[email or web address]     The patient demonstrates the following risk factors for suicide: Chronic risk factors for suicide include: psychiatric disorder of none. Acute risk factors for suicide include: unemployment. Protective factors for this patient include: responsibility to others (children, family) and hope for the future. Considering these factors, the overall suicide risk at this point appears to be low. Patient is appropriate for outpatient follow up.    Collaboration of Care: Collaboration of Care: Other reviewed notes in Epic  Patient/Guardian was advised Release of Information must be obtained prior to any record release in order to collaborate their care with an outside provider. Patient/Guardian was advised if they have not already done so to contact the registration department to sign all necessary forms in order for [place] to release information regarding their care.   Consent: Patient/Guardian gives verbal consent for treatment and assignment of benefits for services provided during this visit. Patient/Guardian expressed understanding and agreed to proceed.    Neysa Hotter, MD 02/22/2023, 9:34 AM

## 2023-02-18 ENCOUNTER — Ambulatory Visit
Admission: EM | Admit: 2023-02-18 | Discharge: 2023-02-18 | Disposition: A | Discharge status: Home / Self Care | Payer: Medicaid Other | Attending: Nurse Practitioner | Admitting: Nurse Practitioner

## 2023-02-18 DIAGNOSIS — L259 Unspecified contact dermatitis, unspecified cause: Secondary | ICD-10-CM | POA: Diagnosis not present

## 2023-02-18 MED ORDER — TRIAMCINOLONE ACETONIDE 0.025 % EX OINT: 1.0000 | TOPICAL_OINTMENT | Freq: Two times a day (BID) | CUTANEOUS | 0 refills | Status: AC

## 2023-02-18 NOTE — ED Triage Notes (Signed)
Pt reports she has  a rash under her right breast x 2 weeks. Tried antibacterial soap but no relief. Water hurts when it touches it.

## 2023-02-18 NOTE — ED Provider Notes (Signed)
[facility]    CSN: [id number] Arrival date & time: 02/18/23  1207      History   Chief Complaint No chief complaint on file.   HPI Faith Allen is a 37 y.o. female.   Patient presents today for right-sided chest wall rash that has been present for the past couple of weeks.  Reports initially, the area started as a small pimple and thinks that she may have scratched at it which made it spread.  She reports now, the rash is red and itchy and painful.  She has been applying Neosporin and keeping it covered with a large rectangle Band-Aid and reports the rash is only spreading.  No fevers or nausea/vomiting.    Past Medical History:  Diagnosis Date   Anemia    Attention deficit disorder    Medical history non-contributory     Patient Active Problem List   Diagnosis Date Noted   Attention and concentration deficit 08/03/2019   Encounter for general adult medical examination with abnormal findings 08/03/2019   Fatigue 08/03/2019   S/P tubal ligation 11/16/2016   Constipation 12/31/2014   Rectal bleeding 12/31/2014   Uterine fibroids 03/03/2014    Past Surgical History:  Procedure Laterality Date   NO PAST SURGERIES     TUBAL LIGATION Bilateral 10/07/2016   Procedure: POST PARTUM TUBAL LIGATION;  Surgeon: Lazaro Arms, MD;  Location: [facility];  Service: Gynecology;  Laterality: Bilateral;    OB History     Gravida  4   Para  4   Term  4   Preterm      AB      Living  4      SAB      IAB      Ectopic      Multiple  0   Live Births  4            Home Medications    Prior to Admission medications   Medication Sig Start Date End Date Taking? Authorizing Provider  triamcinolone (KENALOG) 0.025 % ointment Apply 1 Application topically 2 (two) times daily. Apply sparingly to skin up to 2 times daily as needed and leave open to air 02/18/23  Yes Valentino Nose, NP  Cholecalciferol 1.25 MG (50000 UT) TABS Take 5,000 Int'l Units by  mouth.    [provider]  dexmethylphenidate (FOCALIN XR) 5 MG 24 hr capsule Take 1 capsule (5 mg total) by mouth every morning. Patient not taking: Reported on 02/08/2023 01/09/23 02/08/23  Neysa Hotter, MD  dexmethylphenidate (FOCALIN XR) 5 MG 24 hr capsule Take 1 capsule (5 mg total) by mouth every morning. Patient not taking: Reported on 02/08/2023 02/08/23 03/10/23  Neysa Hotter, MD  nortriptyline (PAMELOR) 10 MG capsule Take 1 capsule (10 mg total) by mouth at bedtime. 12/28/22   Del Nigel Berthold, FNP  Probiotic Product (FORTIFY PROBIOTIC WOMENS PO) Take by mouth. Patient not taking: Reported on 02/08/2023    [provider]    Family History Family History  Problem Relation Age of Onset   Diabetes Mother    Asthma Mother    Miscarriages / Stillbirths Sister    Irritable bowel syndrome Sister    Irritable bowel syndrome Sister    Anxiety disorder Sister    Inflammatory bowel disease Neg Hx    Colon cancer Neg Hx     Social History Social History   Tobacco Use   Smoking status: Former  Years: 1    Types: Cigarettes    Quit date: 07/05/2007    Years since quitting: 15.6   Smokeless tobacco: Never   Tobacco comments:    Would smoke socially  Vaping Use   Vaping Use: Former  Substance Use Topics   Alcohol use: Yes    Alcohol/week: 3.0 standard drinks of alcohol    Types: 2 Glasses of wine, 1 Shots of liquor per week    Comment: Once a month   Drug use: No     Allergies   Patient has no known allergies.   Review of Systems Review of Systems Per HPI  Physical Exam Triage Vital Signs ED Triage Vitals  Enc Vitals Group     BP 02/18/23 1218 110/62     Pulse Rate 02/18/23 1218 64     Resp 02/18/23 1218 18     Temp 02/18/23 1218 98 F (36.7 C)     Temp Source 02/18/23 1218 Oral     SpO2 02/18/23 1218 96 %     Weight --      Height --      Head Circumference --      Peak Flow --      Pain Score 02/18/23 1219 5     Pain Loc --      Pain  Edu? --      Excl. in GC? --    No data found.  Updated Vital Signs BP 110/62 (BP Location: Right Arm)   Pulse 64   Temp 98 F (36.7 C) (Oral)   Resp 18   LMP 02/12/2023   SpO2 96%   Visual Acuity Right Eye Distance:   Left Eye Distance:   Bilateral Distance:    Right Eye Near:   Left Eye Near:    Bilateral Near:     Physical Exam Vitals and nursing note reviewed.  Constitutional:      General: She is not in acute distress.    Appearance: Normal appearance. She is not toxic-appearing.  HENT:     Right Ear: External ear normal.     Left Ear: External ear normal.     Mouth/Throat:     Mouth: Mucous membranes are moist.     Pharynx: Oropharynx is clear.  Pulmonary:     Effort: Pulmonary effort is normal. No respiratory distress.  Chest:       Comments: Slightly raised, erythematous, irregularly shaped plaque to right rib cage in approximately area marked.  No fluctuance, active drainage, warmth.  Satellite lesions present.  Musculoskeletal:     Cervical back: Normal range of motion.  Lymphadenopathy:     Cervical: No cervical adenopathy.  Skin:    General: Skin is warm and dry.     Findings: Erythema present.  Neurological:     Mental Status: She is alert and oriented to person, place, and time.  Psychiatric:        Behavior: Behavior is cooperative.      UC Treatments / Results  Labs (all labs ordered are listed, but only abnormal results are displayed) Labs Reviewed - No data to display  EKG   Radiology No results found.  Procedures Procedures (including critical care time)  Medications Ordered in UC Medications - No data to display  Initial Impression / Assessment and Plan / UC Course  I have reviewed the triage vital signs and the nursing notes.  Pertinent labs & imaging results that were available during my care of the patient were  reviewed by me and considered in my medical decision making (see chart for details).   Patient is  well-appearing, normotensive, afebrile, not tachycardic, not tachypneic, oxygenating well on room air.    1. Contact dermatitis, unspecified contact dermatitis type, unspecified trigger Will treat with topical triamcinolone ointment up to twice daily applied to clean skin as needed for itching/redness Recommended discontinuing use of Neosporin and Band-Aids as I suspect patient is having allergic reaction Seek care for persistent/worsening symptoms despite treatment  The patient was given the opportunity to ask questions.  All questions answered to their satisfaction.  The patient is in agreement to this plan.    Final Clinical Impressions(s) / UC Diagnoses   Final diagnoses:  Contact dermatitis, unspecified contact dermatitis type, unspecified trigger     Discharge Instructions      The rash is consistent with contact dermatitis - likely secondary to neosporin.  Please stop using the Neosporin and bandages.  Clean the rash twice daily with mild soap/water and gently pat dry.  Then start using the steroid ointment up to 2 times daily as needed for redness/itching and leave rash open to air.  Seek care for persistent/worsening symptoms despite treatment.   ED Prescriptions     Medication Sig Dispense Auth. Provider   triamcinolone (KENALOG) 0.025 % ointment Apply 1 Application topically 2 (two) times daily. Apply sparingly to skin up to 2 times daily as needed and leave open to air 15 g Valentino Nose, NP      PDMP not reviewed this encounter.   Valentino Nose, NP 02/18/23 1346

## 2023-02-18 NOTE — Discharge Instructions (Signed)
The rash is consistent with contact dermatitis - likely secondary to neosporin.  Please stop using the Neosporin and bandages.  Clean the rash twice daily with mild soap/water and gently pat dry.  Then start using the steroid ointment up to 2 times daily as needed for redness/itching and leave rash open to air.  Seek care for persistent/worsening symptoms despite treatment.

## 2023-02-22 ENCOUNTER — Telehealth (INDEPENDENT_AMBULATORY_CARE_PROVIDER_SITE_OTHER): Payer: Medicaid Other | Admitting: Psychiatry

## 2023-02-22 ENCOUNTER — Encounter: Payer: Self-pay | Admitting: Psychiatry

## 2023-02-22 DIAGNOSIS — F900 Attention-deficit hyperactivity disorder, predominantly inattentive type: Secondary | ICD-10-CM

## 2023-02-22 MED ORDER — DEXMETHYLPHENIDATE HCL ER 10 MG PO CP24: 10.0000 mg | ORAL_CAPSULE | ORAL | 0 refills | Status: DC
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
## 2023-04-17 ENCOUNTER — Encounter: Payer: Self-pay | Admitting: Psychiatry

## 2023-04-17 ENCOUNTER — Telehealth: Payer: Medicaid Other | Admitting: Psychiatry

## 2023-04-17 DIAGNOSIS — F325 Major depressive disorder, single episode, in full remission: Secondary | ICD-10-CM

## 2023-04-17 DIAGNOSIS — F900 Attention-deficit hyperactivity disorder, predominantly inattentive type: Secondary | ICD-10-CM | POA: Diagnosis not present

## 2023-04-17 MED ORDER — AMPHETAMINE-DEXTROAMPHET ER 5 MG PO CP24: 5.0000 mg | ORAL_CAPSULE | ORAL | 0 refills | Status: DC

## 2023-04-17 NOTE — Patient Instructions (Signed)
Discontinue Focalin  Start Adderall XR 5 mg daily  Next appointment- 10/16 at 8 40

## 2023-05-17 ENCOUNTER — Ambulatory Visit: Payer: Medicaid Other | Admitting: Professional Counselor

## 2023-05-17 DIAGNOSIS — F330 Major depressive disorder, recurrent, mild: Secondary | ICD-10-CM

## 2023-05-17 NOTE — BH Specialist Note (Signed)
[place] Virtual BH Telephone Follow-up  MRN: [id number] NAME: DANIELLIE STRYKER Date: 05/17/23  Start time: Start Time: 1105 End time: Stop Time: 1145 Total time: Total Time in Minutes (Visit): 40 Call number: Visit Number: 4- Fourth Visit  Reason for call today:  The patient, a 37 year old female, presented for a collaborative care follow-up after several months. Since our last visit, she was connected to psychiatry and began medication for ADHD. She presents in a positive mood, with GAD-7 and PHQ-9 scores showing significant improvement. 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 She is eager to set goals to maintain her stable mood during this transition. The patient reports decreased stress and anxiety, feeling more organized, focused, and energized due to both the medication and her new career interest. We will continue to monitor her progress and work on goal-setting, with a follow-up scheduled in two weeks or one month.  PHQ-9 Scores:     05/17/2023   11:09 AM 02/08/2023    9:37 AM 12/27/2022    9:17 AM 12/14/2022   11:37 AM 12/04/2022    8:37 AM  Depression screen PHQ 2/9  Decreased Interest 1 0 1 1 1   Down, Depressed, Hopeless 0 1 0 0 1  PHQ - 2 Score 1 1 1 1 2   Altered sleeping 1 0 0 2 1  Tired, decreased energy 1 1 1 1 2   Change in appetite 0 0 2 2 1   Feeling bad or failure about yourself  0 0 1 1 2   Trouble concentrating 0 1 3 1 3   Moving slowly or fidgety/restless 1 0 1 1 2   Suicidal thoughts 0 0 0 0 0  PHQ-9 Score 4 3 9 9 13   Difficult doing work/chores  Somewhat difficult Very difficult Very difficult Extremely dIfficult   GAD-7 Scores:     05/17/2023   11:10 AM 02/08/2023    9:37 AM 12/27/2022    9:24 AM 12/14/2022   11:45 AM  GAD 7 : Generalized Anxiety  Score  Nervous, Anxious, on Edge 1 1 0 0  Control/stop worrying 0 0 0 0  Worry too much - different things 1 0 1 0  Trouble relaxing 1 1 1 1   Restless 0 0 0 0  Easily annoyed or irritable 1 1 1 1   Afraid - awful might happen 0 0 0 0  Total GAD 7 Score 4 3 3 2   Anxiety Difficulty Somewhat difficult Somewhat difficult Somewhat difficult     Stress Current stressors:  Kids Sleep:  Good Appetite:  Good Coping ability:  Good Patient taking medications as prescribed:  Yes  Current medications:  Outpatient Encounter Medications as of 05/17/2023  Medication Sig   amphetamine-dextroamphetamine (ADDERALL XR) 5 MG 24 hr capsule Take 1 capsule (5 mg total) by mouth every morning.   amphetamine-dextroamphetamine (ADDERALL XR) 5 MG 24 hr capsule Take 1 capsule (5 mg total) by mouth every morning.   Cholecalciferol 1.25 MG (50000 UT) TABS Take 5,000 Int'l Units by mouth.   dexmethylphenidate (FOCALIN XR) 10 MG 24 hr capsule Take 1 capsule (10 mg total) by mouth every morning.   dexmethylphenidate (FOCALIN XR) 10 MG 24 hr capsule Take 1 capsule (10 mg total) by mouth every morning.   nortriptyline (PAMELOR) 10 MG capsule Take 1 capsule (10 mg total)  by mouth at bedtime. (Patient not taking: Reported on 02/22/2023)   Probiotic Product (FORTIFY PROBIOTIC WOMENS PO) Take by mouth. (Patient not taking: Reported on 02/08/2023)   triamcinolone (KENALOG) 0.025 % ointment Apply 1 Application topically 2 (two) times daily. Apply sparingly to skin up to 2 times daily as needed and leave open to air   No facility-administered encounter medications on file as of 05/17/2023.     Self-harm Behaviors Risk Assessment Self-harm risk factors:  None Patient endorses recent thoughts of harming self:  Denies   Danger to Others Risk Assessment Danger to others risk factors:  None Patient endorses recent thoughts of harming others:  Denies   Goals, Interventions and Follow-up Plan Goals: Increase healthy adjustment  to current life circumstances Interventions: Mindfulness or Relaxation Training and CBT Cognitive Behavioral Therapy Follow-up Plan:  two weeks    Reuel Boom

## 2023-06-14 ENCOUNTER — Ambulatory Visit: Payer: Medicaid Other | Admitting: Professional Counselor

## 2023-06-15 NOTE — Progress Notes (Unsigned)
Virtual Visit via Video Note  I connected with Faith Allen on 06/19/23 at  8:40 AM EDT by a video enabled telemedicine application and verified that I am speaking with the correct person using two identifiers.  Location: Patient: car Provider: office Persons participated in the visit- patient, provider    I discussed the limitations of evaluation and management by telemedicine and the availability of in person appointments. The patient expressed understanding and agreed to proceed.    I discussed the assessment and treatment plan with the patient. The patient was provided an opportunity to ask questions and all were answered. The patient agreed with the plan and demonstrated an understanding of the instructions.   The patient was advised to call back or seek an in-person evaluation if the symptoms worsen or if the condition fails to improve as anticipated.  I provided 20 minutes of non-face-to-face time during this encounter.   Neysa Hotter, MD    [facility] MD/PA/NP OP Progress Note  06/19/2023 9:07 AM Faith Allen  MRN:  [id number]  Chief Complaint:  Chief Complaint  Patient presents with   Follow-up   HPI:  This is a follow-up appointment for ADHD and depression.  She states that Adderall XR did not make any difference, although she denies having any side effect.  She decided to wait until this appointment to discuss this as she was just started the new medication.  She occasionally takes Focalin from the previous prescription, as it helps for bowel movement.  However, Focalin 10 mg continues to cause her jitteriness.  She thinks she needs to be planning out for a day as otherwise it is challenging. She has been taking online course, and feels excited about this.  She thinks it gives her more job opportunities.  She has been busy, taking her children to soccer and volleyball games.  She has fun.  She also make sure to take time with her youngest one, who tends to be irritable.  She  agrees that she has been intuitive to their needs.  She sleeps well.  She is unsure about her mood, as she is busy getting through the day.  She reports better relationship with her husband, who is now open to talk about his emotion.  She denies SI.  She is willing to try higher dose of Adderall at this time.    Substance use   Tobacco Alcohol Other substances/  Current denies Quit in Jan 2024 (used to drink a glass of wine a few times per week) denies  Past denies Maximum-3 glasses of wine at dinner Marijuana BID until 20's, quit in 2022  Past Treatment            Daily routine: spends time with her husband and children in the morning. She goes to park or visits her father until her husband return from work Employment: works at [employer]: husband and four children Marital status: married since 1990 Number of children: 4, age 17, 4, 53, 3 Education: one year college of Early childhood education. She received certificate, She has GED; she stopped going to school at 12 th grade. Although she does not remember the reason, she states that she wanted to hang out with her friends instead.  She moved to [place] at age 50  Visit Diagnosis:    ICD-10-CM   1. Attention deficit hyperactivity disorder (ADHD), predominantly inattentive type  F90.0     2. Major depressive disorder with single episode, in full remission (HCC)  F32.5       Past Psychiatric History: Please see initial evaluation for full details. I have reviewed the history. No updates at this time.     Past Medical History:  Past Medical History:  Diagnosis Date   Anemia    Attention deficit disorder    Medical history non-contributory     Past Surgical History:  Procedure Laterality Date   NO PAST SURGERIES     TUBAL LIGATION Bilateral 10/07/2016   Procedure: POST PARTUM TUBAL LIGATION;  Surgeon: Lazaro Arms, MD;  Location: [facility];  Service: Gynecology;  Laterality: Bilateral;    Family Psychiatric History: Please see  initial evaluation for full details. I have reviewed the history. No updates at this time.     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  Tobacco Use   Smoking status: Former    Current packs/day: 0.00    Types: Cigarettes    Start date: 07/04/2006    Quit date: 07/05/2007    Years since quitting: 15.9   Smokeless tobacco: Never   Tobacco comments:    Would smoke socially  Vaping Use   Vaping status: Former  Substance and Sexual Activity   Alcohol use: Yes    Alcohol/week: 3.0 standard drinks of alcohol    Types: 2 Glasses of wine, 1 Shots of liquor per week    Comment: Once a month   Drug use: No   Sexual activity: Not Currently    Birth control/protection: Surgical    Comment: tubal  Other Topics Concern   Not on file  Social History Narrative   Not on file   Social Determinants of Health   Financial Resource Strain: Not on file  Food Insecurity: Not on file  Transportation Needs: Not on file  Physical Activity: Not on file  Stress: Not on file  Social Connections: Not on file    Allergies: No Known Allergies  Metabolic Disorder Labs: Lab Results  Component Value Date   HGBA1C 5.8 (H) 12/04/2022   MPG 108 08/03/2019   No results found for: "PROLACTIN" Lab Results  Component Value Date   CHOL 125 12/04/2022   TRIG 78 12/04/2022   HDL 44 12/04/2022   CHOLHDL 2.8 12/04/2022   LDLCALC 65 12/04/2022   LDLCALC 80 08/03/2019   Lab  Results  Component Value Date   TSH 1.510 12/04/2022   TSH 0.89 07/27/2020    Therapeutic Level Labs: No results found for: "LITHIUM" No results found for: "VALPROATE" No results found for: "CBMZ"  Current Medications: Current Outpatient Medications  Medication Sig Dispense Refill   amphetamine-dextroamphetamine (ADDERALL XR) 5 MG 24 hr capsule Take 1 capsule (5 mg total) by mouth every morning. 30 capsule 0   amphetamine-dextroamphetamine (ADDERALL XR) 5 MG 24 hr capsule Take 1 capsule (5 mg total) by mouth every morning. 30 capsule  0   Cholecalciferol 1.25 MG (50000 UT) TABS Take 5,000 Int'l Units by mouth.     dexmethylphenidate (FOCALIN XR) 10 MG 24 hr capsule Take 1 capsule (10 mg total) by mouth every morning. 30 capsule 0   dexmethylphenidate (FOCALIN XR) 10 MG 24 hr capsule Take 1 capsule (10 mg total) by mouth every morning. 30 capsule 0   nortriptyline (PAMELOR) 10 MG capsule Take 1 capsule (10 mg total) by mouth at bedtime. (Patient not taking: Reported on 02/22/2023) 30 capsule 3   Probiotic Product (FORTIFY PROBIOTIC WOMENS PO) Take by mouth. (Patient not taking: Reported on 02/08/2023)     triamcinolone (KENALOG) 0.025 % ointment Apply 1 Application topically 2 (two) times daily. Apply sparingly to skin up to 2 times daily as needed and leave open to air 15 g 0   No current facility-administered medications for this visit.     Musculoskeletal: Strength & Muscle Tone:  N/A Gait & Station:  N/A Patient leans: N/A  Psychiatric Specialty Exam: Review of Systems  Psychiatric/Behavioral:  Positive for decreased concentration. Negative for agitation, behavioral problems, confusion, dysphoric mood, hallucinations, self-injury, sleep disturbance and suicidal ideas. The patient is not nervous/anxious and is not hyperactive.   All other systems reviewed and are negative.   There were no vitals taken for this visit.There is no height or weight on file to calculate BMI.  General  Appearance: Well Groomed  Eye Contact:  Good  Speech:  Clear and Coherent  Volume:  Normal  Mood:   good  Affect:  Appropriate, Congruent, and Full Range  Thought Process:  Coherent  Orientation:  Full (Time, Place, and Person)  Thought Content: Logical   Suicidal Thoughts:  No  Homicidal Thoughts:  No  Memory:  Immediate;   Good  Judgement:  Good  Insight:  Good  Psychomotor Activity:  Normal  Concentration:  Concentration: Good and Attention Span: Good  Recall:  Good  Fund of Knowledge: Good  Language: Good  Akathisia:  No  Handed:  Right  AIMS (if indicated): not done  Assets:  Communication Skills Desire for Improvement  ADL's:  Intact  Cognition: WNL  Sleep:  Good   Screenings: GAD-7    Flowsheet Row Integrated Behavioral Health from 05/17/2023 in [facility] Office Visit from 02/08/2023 in [facility] Integrated Behavioral Health from 12/27/2022 in [facility] Integrated Behavioral Health from 12/14/2022 in [facility] Office Visit from 12/04/2022 in [facility]  Total GAD-7 Score 4 3 3 2 5       PHQ2-9    Flowsheet Row Integrated Behavioral Health from 05/17/2023 in [facility] Office Visit from 02/08/2023 in [facility] Integrated Behavioral Health from 12/27/2022 in [facility] Integrated Behavioral Health from 12/14/2022 in [facility] Office Visit from 12/04/2022 in [facility] [facility]  PHQ-2 Total Score 1 1 1 1 2   PHQ-9 Total Score 4 3 9 9 13       Flowsheet Row ED from 02/18/2023 in [facility] ED from 10/14/2021 in [facility] [place] Video Visit from 10/19/2020 in [facility]  C-SSRS RISK CATEGORY No Risk No Risk No Risk        Assessment and Plan:  Faith Allen is a 37 y.o. year old female with a history of  ADHD, depression, who presents for follow up appointment for below.   1. Attention deficit hyperactivity disorder (ADHD), predominantly inattentive type -She was on ADHD medication when she was in elementary school. Although formal neuropsychological testing has not been conducted, she does have a history of childhood ADHD, and the clinical course is consistent with ADHD.  UDS negative 01/2023  Worsening in ADHD symptoms given the current dose of adderall made no difference, although she is able to utilize skills for organization.  Will uptitrate Adderall XR to optimize treatment for ADHD.  She expressed understanding to take this dose, and notify the office about its effect.   2. Major depressive disorder with single episode, in full remission (HCC) Overall improving.  Will continue to assess as needed.  She will continue to see [facility] specialist for therapy.      Plan Increase Adderall XR 10 mg daily - she will notify the clinic about its effectiveness. Will plan to fill afterwards Next appointment- 12/5 at 8 am for 20 mins, video Father cell phone-  [phone], **[email or web address]   Past trials- Focalin/jitteriness at 10 mg   The patient demonstrates the following risk factors for suicide: Chronic risk factors for suicide include: psychiatric disorder of none. Acute risk factors for suicide include: unemployment. Protective factors for this patient include: responsibility to others (children, family) and hope for the future. Considering these factors, the overall suicide risk at this point appears to be low. Patient is appropriate for outpatient follow up.    Collaboration of Care: Collaboration of Care: Other reviewed notes in Epic  Patient/Guardian was advised Release of Information must be obtained prior to any record release in order to collaborate their care with an outside provider. Patient/Guardian was advised if they have not  already done so to contact the registration department to sign all necessary forms in order for [place] to release information regarding their care.   Consent: Patient/Guardian gives verbal consent for treatment and assignment of benefits for services provided during this visit. Patient/Guardian expressed understanding and agreed to proceed.    Neysa Hotter, MD 06/19/2023, 9:07 AM

## 2023-06-19 ENCOUNTER — Encounter: Payer: Self-pay | Admitting: Psychiatry

## 2023-06-19 ENCOUNTER — Telehealth (INDEPENDENT_AMBULATORY_CARE_PROVIDER_SITE_OTHER): Payer: Medicaid Other | Admitting: Psychiatry

## 2023-06-19 DIAGNOSIS — F900 Attention-deficit hyperactivity disorder, predominantly inattentive type: Secondary | ICD-10-CM | POA: Diagnosis not present

## 2023-06-19 DIAGNOSIS — F325 Major depressive disorder, single episode, in full remission: Secondary | ICD-10-CM | POA: Diagnosis not present

## 2023-06-19 NOTE — Patient Instructions (Signed)
Increase Adderall XR 10 mg daily Next appointment- 12/5 at 8 am

## 2023-06-27 ENCOUNTER — Ambulatory Visit
Admission: EM | Admit: 2023-06-27 | Discharge: 2023-06-27 | Disposition: A | Discharge status: Home / Self Care | Payer: Medicaid Other | Attending: Family Medicine | Admitting: Family Medicine

## 2023-06-27 ENCOUNTER — Other Ambulatory Visit: Payer: Self-pay

## 2023-06-27 ENCOUNTER — Encounter: Payer: Self-pay | Admitting: Emergency Medicine

## 2023-06-27 DIAGNOSIS — J069 Acute upper respiratory infection, unspecified: Secondary | ICD-10-CM

## 2023-06-27 DIAGNOSIS — R0789 Other chest pain: Secondary | ICD-10-CM

## 2023-06-27 LAB — POCT INFLUENZA A/B
Influenza A, POC: NEGATIVE
Influenza B, POC: NEGATIVE

## 2023-06-27 MED ORDER — ALBUTEROL SULFATE HFA 108 (90 BASE) MCG/ACT IN AERS: 2.0000 | INHALATION_SPRAY | RESPIRATORY_TRACT | 0 refills | Status: AC | PRN

## 2023-06-27 MED ORDER — PROMETHAZINE-DM 6.25-15 MG/5ML PO SYRP: 5.0000 mL | ORAL_SOLUTION | Freq: Four times a day (QID) | ORAL | 0 refills | Status: AC | PRN

## 2023-06-27 NOTE — ED Triage Notes (Signed)
Pt reports generalized body aches, cough, chest tightness/nausea, headache for last several days. Pt reports spouse had covid last week.

## 2023-07-01 NOTE — ED Provider Notes (Signed)
[facility]    CSN: [id number] Arrival date & time: 06/27/23  0803      History   Chief Complaint Chief Complaint  Patient presents with   Cough    HPI Faith Allen is a 37 y.o. female.   Patient presenting today with several day history of generalized bodyaches, chest tightness, headache, cough, runny nose.  Denies chest pain, shortness of breath, abdominal pain, vomiting, diarrhea.  So far trying over-the-counter remedies with minimal relief.  States husband had COVID last week.  No known history of chronic pulmonary disease.   Past Medical History:  Diagnosis Date   Anemia    Attention deficit disorder    Medical history non-contributory     Patient Active Problem List   Diagnosis Date Noted   Attention and concentration deficit 08/03/2019   Encounter for general adult medical examination with abnormal findings 08/03/2019   Fatigue 08/03/2019   S/P tubal ligation 11/16/2016   Constipation 12/31/2014   Rectal bleeding 12/31/2014   Uterine fibroids 03/03/2014    Past Surgical History:  Procedure Laterality Date   NO PAST SURGERIES     TUBAL LIGATION Bilateral 10/07/2016   Procedure: POST PARTUM TUBAL LIGATION;  Surgeon: Lazaro Arms, MD;  Location: [facility];  Service: Gynecology;  Laterality: Bilateral;   OB History     Gravida  4   Para  4   Term  4   Preterm      AB      Living  4      SAB      IAB      Ectopic      Multiple  0   Live Births  4            Home Medications    Prior to Admission medications   Medication Sig Start Date End Date Taking? Authorizing Provider  albuterol (VENTOLIN HFA) 108 (90 Base) MCG/ACT inhaler Inhale 2 puffs into the lungs every 4 (four) hours as needed for wheezing or shortness of breath. 06/27/23  Yes Particia Nearing, PA-C  promethazine-dextromethorphan (PROMETHAZINE-DM) 6.25-15 MG/5ML syrup Take 5 mLs by mouth 4 (four) times daily as needed. 06/27/23  Yes Particia Nearing,  PA-C  amphetamine-dextroamphetamine (ADDERALL XR) 5 MG 24 hr capsule Take 1 capsule (5 mg total) by mouth every morning. 04/17/23 05/17/23  Neysa Hotter, MD  amphetamine-dextroamphetamine (ADDERALL XR) 5 MG 24 hr capsule Take 1 capsule (5 mg total) by mouth every morning. 05/17/23 06/16/23  Neysa Hotter, MD  Cholecalciferol 1.25 MG (50000 UT) TABS Take 5,000 Int'l Units by mouth.    [provider]  dexmethylphenidate (FOCALIN XR) 10 MG 24 hr capsule Take 1 capsule (10 mg total) by mouth every morning. 02/22/23 03/24/23  Neysa Hotter, MD  dexmethylphenidate (FOCALIN XR) 10 MG 24 hr capsule Take 1 capsule (10 mg total) by mouth every morning. 03/24/23 04/23/23  Neysa Hotter, MD  nortriptyline (PAMELOR) 10 MG capsule Take 1 capsule (10 mg total) by mouth at bedtime. Patient not taking: Reported on 02/22/2023 12/28/22   Del Nigel Berthold, FNP  Probiotic Product (FORTIFY PROBIOTIC WOMENS PO) Take by mouth. Patient not taking: Reported on 02/08/2023    [provider]  triamcinolone (KENALOG) 0.025 % ointment Apply 1 Application topically 2 (two) times daily. Apply sparingly to skin up to 2 times daily as needed and leave open to air 02/18/23   Valentino Nose, NP    Family History Family History  Problem Relation Age of Onset   Diabetes Mother    Asthma Mother    Miscarriages / Stillbirths Sister    Irritable bowel syndrome Sister    Irritable bowel syndrome Sister    Anxiety disorder Sister    Inflammatory bowel disease Neg Hx    Colon cancer Neg Hx     Social History Social History   Tobacco Use   Smoking status: Former    Current packs/day: 0.00    Types: Cigarettes    Start date: 07/04/2006    Quit date: 07/05/2007    Years since quitting: 16.0   Smokeless tobacco: Never   Tobacco comments:    Would smoke socially  Vaping Use   Vaping status: Former  Substance Use Topics   Alcohol use: Yes    Alcohol/week: 3.0 standard drinks of alcohol    Types: 2  Glasses of wine, 1 Shots of liquor per week    Comment: Once a month   Drug use: No     Allergies   Patient has no known allergies.   Review of Systems Review of Systems Per HPI  Physical Exam Triage Vital Signs ED Triage Vitals  Encounter Vitals Group     BP 06/27/23 0816 129/85     Systolic BP Percentile --      Diastolic BP Percentile --      Pulse Rate 06/27/23 0816 86     Resp 06/27/23 0816 20     Temp 06/27/23 0816 98.6 F (37 C)     Temp Source 06/27/23 0816 Oral     SpO2 06/27/23 0816 99 %     Weight --      Height --      Head Circumference --      Peak Flow --      Pain Score 06/27/23 0818 6     Pain Loc --      Pain Education --      Exclude from Growth Chart --    No data found.  Updated Vital Signs BP 129/85 (BP Location: Right Arm)   Pulse 86   Temp 98.6 F (37 C) (Oral)   Resp 20   LMP 06/26/2023 (Approximate)   SpO2 99%   Visual Acuity Right Eye Distance:   Left Eye Distance:   Bilateral Distance:    Right Eye Near:   Left Eye Near:    Bilateral Near:     Physical Exam Vitals and nursing note reviewed.  Constitutional:      Appearance: Normal appearance.  HENT:     Head: Atraumatic.     Right Ear: Tympanic membrane and external ear normal.     Left Ear: Tympanic membrane and external ear normal.     Nose: Rhinorrhea present.     Mouth/Throat:     Mouth: Mucous membranes are moist.     Pharynx: Posterior oropharyngeal erythema present.  Eyes:     Extraocular Movements: Extraocular movements intact.     Conjunctiva/sclera: Conjunctivae normal.  Cardiovascular:     Rate and Rhythm: Normal rate and regular rhythm.     Heart sounds: Normal heart sounds.  Pulmonary:     Effort: Pulmonary effort is normal.     Breath sounds: Normal breath sounds. No wheezing or rales.  Musculoskeletal:        General: Normal range of motion.     Cervical back: Normal range of motion and neck supple.  Skin:    General: Skin is warm and  dry.   Neurological:     Mental Status: She is alert and oriented to person, place, and time.  Psychiatric:        Mood and Affect: Mood normal.        Thought Content: Thought content normal.      UC Treatments / Results  Labs (all labs ordered are listed, but only abnormal results are displayed) Labs Reviewed  POCT INFLUENZA A/B    EKG   Radiology No results found.  Procedures Procedures (including critical care time)  Medications Ordered in UC Medications - No data to display  Initial Impression / Assessment and Plan / UC Course  I have reviewed the triage vital signs and the nursing notes.  Pertinent labs & imaging results that were available during my care of the patient were reviewed by me and considered in my medical decision making (see chart for details).     Vitals and exam overall reassuring and suggestive of a viral respiratory infection.  Rapid flu negative, consistent with viral respiratory infection.  Treat with albuterol, Phenergan DM, supportive care medications and home care.  Return for worsening symptoms.  Final Clinical Impressions(s) / UC Diagnoses   Final diagnoses:  Viral URI with cough  Chest tightness   Discharge Instructions   None    ED Prescriptions     Medication Sig Dispense Auth. Provider   albuterol (VENTOLIN HFA) 108 (90 Base) MCG/ACT inhaler Inhale 2 puffs into the lungs every 4 (four) hours as needed for wheezing or shortness of breath. 18 g Roosvelt Maser [place], [place]   promethazine-dextromethorphan (PROMETHAZINE-DM) 6.25-15 MG/5ML syrup Take 5 mLs by mouth 4 (four) times daily as needed. 100 mL Particia Nearing, [place]      PDMP not reviewed this encounter.   Roosvelt Maser [place], [place] 07/01/23 [phone]

## 2023-07-13 ENCOUNTER — Other Ambulatory Visit: Payer: Self-pay | Admitting: Family Medicine

## 2023-08-04 NOTE — Progress Notes (Unsigned)
Virtual Visit via Video Note  I connected with Faith Allen on 08/08/23 at  8:00 AM EST by a video enabled telemedicine application and verified that I am speaking with the correct person using two identifiers.  Location: Patient: home Provider: office Persons participated in the visit- patient, provider    I discussed the limitations of evaluation and management by telemedicine and the availability of in person appointments. The patient expressed understanding and agreed to proceed.    I discussed the assessment and treatment plan with the patient. The patient was provided an opportunity to ask questions and all were answered. The patient agreed with the plan and demonstrated an understanding of the instructions.   The patient was advised to call back or seek an in-person evaluation if the symptoms worsen or if the condition fails to improve as anticipated.  I provided 20 minutes of non-face-to-face time during this encounter.   Neysa Hotter, MD    [facility] MD/PA/NP OP Progress Note  08/08/2023 8:41 AM Faith Allen  MRN:  [id number]  Chief Complaint:  Chief Complaint  Patient presents with   Follow-up   HPI:  This is a follow-up appointment for ADHD and depression.  She states that she had some sickness.  She is doing well otherwise.  She had a good Thanksgiving with both of her family, celebrating twice.  Although she tried Adderall XR 10 mg, she felt jittery.  She did not see any benefit from this.  She stopped taking medication as she was feeling sick, and also because she was not in school.  However, she wishes that she could focus better and do the best.  She is unsure if she is depressed.  She is always optimistic and joyful, but she is not sure if she really feels this way.  She enjoys being around with her family.  She denies insomnia.  She does binge eating, which she associates with stress.  She denies SI.  She agrees with the plan as outlined below.   Substance use    Tobacco Alcohol Other substances/  Current denies Quit in Jan 2024 (used to drink a glass of wine a few times per week) Denies, one coffee  Past denies Maximum-3 glasses of wine at dinner Marijuana BID until 20's, quit in 2022  Past Treatment            Daily routine: spends time with her husband and children in the morning. She goes to park or visits her father until her husband return from work Employment: works at [employer]: husband and four children Marital status: married since 1990 Number of children: 4, age 78, 64, 58, 3 Education: one year college of Early childhood education. She received certificate, She has GED; she stopped going to school at 12 th grade. Although she does not remember the reason, she states that she wanted to hang out with her friends instead.  She moved to [place] at age 67  Visit Diagnosis:    ICD-10-CM   1. Attention deficit hyperactivity disorder (ADHD), predominantly inattentive type  F90.0     2. Major depressive disorder with single episode, in full remission (HCC)  F32.5     3. Binge eating  R63.2       Past Psychiatric History: Please see initial evaluation for full details. I have reviewed the history. No updates at this time.     Past Medical History:  Past Medical History:  Diagnosis Date   Anemia    Attention  deficit disorder    Medical history non-contributory     Past Surgical History:  Procedure Laterality Date   NO PAST SURGERIES     TUBAL LIGATION Bilateral 10/07/2016   Procedure: POST PARTUM TUBAL LIGATION;  Surgeon: Lazaro Arms, MD;  Location: [facility];  Service: Gynecology;  Laterality: Bilateral;    Family Psychiatric History: Please see initial evaluation for full details. I have reviewed the history. No updates at this time.     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  Tobacco Use   Smoking status: Former    Current packs/day: 0.00    Types: Cigarettes    Start date: 07/04/2006    Quit date: 07/05/2007    Years since quitting: 16.1   Smokeless tobacco: Never   Tobacco comments:    Would smoke socially  Vaping Use   Vaping status: Former  Substance and Sexual Activity   Alcohol use: Yes    Alcohol/week: 3.0 standard drinks of alcohol    Types: 2 Glasses of wine, 1 Shots of liquor per week    Comment: Once a month   Drug use: No   Sexual activity: Not Currently    Birth control/protection: Surgical    Comment: tubal  Other Topics Concern   Not on file  Social History Narrative   Not on file   Social Determinants of Health   Financial Resource Strain: Not on file  Food Insecurity: Not on file  Transportation Needs: Not on file  Physical Activity: Not on file  Stress: Not on file  Social Connections: Not on file    Allergies: No Known Allergies  Metabolic Disorder Labs: Lab Results  Component Value Date   HGBA1C 5.8 (H) 12/04/2022   MPG 108 08/03/2019   No results found for: "PROLACTIN" Lab Results  Component Value Date   CHOL 125 12/04/2022   TRIG 78 12/04/2022   HDL 44 12/04/2022   CHOLHDL 2.8 12/04/2022   LDLCALC 65 12/04/2022   LDLCALC 80 08/03/2019   Lab Results  Component Value Date   TSH 1.510 12/04/2022   TSH 0.89 07/27/2020    Therapeutic Level Labs: No results found for: "LITHIUM" No results found for: "VALPROATE" No results found for: "CBMZ"  Current Medications: Current Outpatient Medications  Medication Sig Dispense Refill    lisdexamfetamine (VYVANSE) 10 MG capsule Take 1 capsule (10 mg total) by mouth daily before breakfast. 30 capsule 0   [START ON 09/07/2023] lisdexamfetamine (VYVANSE) 10 MG capsule Take 1 capsule (10 mg total) by mouth daily before breakfast. 30 capsule 0   [START ON 10/07/2023] lisdexamfetamine (VYVANSE) 10 MG capsule Take 1 capsule (10 mg total) by mouth daily before breakfast. 30 capsule 0   albuterol (VENTOLIN HFA) 108 (90 Base) MCG/ACT inhaler Inhale 2 puffs into the lungs every 4 (four) hours as needed for wheezing  or shortness of breath. 18 g 0   Cholecalciferol 1.25 MG (50000 UT) TABS Take 5,000 Int'l Units by mouth.     nortriptyline (PAMELOR) 10 MG capsule Take 1 capsule (10 mg total) by mouth at bedtime. (Patient not taking: Reported on 02/22/2023) 30 capsule 3   Probiotic Product (FORTIFY PROBIOTIC WOMENS PO) Take by mouth. (Patient not taking: Reported on 02/08/2023)     promethazine-dextromethorphan (PROMETHAZINE-DM) 6.25-15 MG/5ML syrup Take 5 mLs by mouth 4 (four) times daily as needed. 100 mL 0   triamcinolone (KENALOG) 0.025 % ointment Apply 1 Application topically 2 (two) times daily. Apply sparingly to skin up to 2 times daily as needed and leave open to air 15 g 0   No current facility-administered medications for this visit.     Musculoskeletal: Strength & Muscle Tone:  N/A Gait & Station:  N/A Patient leans: N/A  Psychiatric Specialty Exam: Review of Systems  Psychiatric/Behavioral:  Positive for decreased concentration. Negative for agitation, behavioral problems, confusion, dysphoric mood, hallucinations, self-injury, sleep disturbance and suicidal ideas. The patient is not nervous/anxious and is not hyperactive.   All other systems reviewed and are negative.   There were no vitals taken for this visit.There is no height or weight on file to calculate BMI.  General Appearance: Well Groomed  Eye Contact:  Good  Speech:  Clear and Coherent  Volume:  Normal  Mood:    good  Affect:  Appropriate, Congruent, and Full Range  Thought Process:  Coherent  Orientation:  Full (Time, Place, and Person)  Thought Content: Logical   Suicidal Thoughts:  No  Homicidal Thoughts:  No  Memory:  Immediate;   Good  Judgement:  Good  Insight:  Good  Psychomotor Activity:  Normal  Concentration:  Concentration: Good and Attention Span: Good  Recall:  Good  Fund of Knowledge: Good  Language: Good  Akathisia:  No  Handed:  Right  AIMS (if indicated): not done  Assets:  Communication Skills Desire for Improvement  ADL's:  Intact  Cognition: WNL  Sleep:  Good   Screenings: GAD-7    Flowsheet Row Integrated Behavioral Health from 05/17/2023 in [facility] Office Visit from 02/08/2023 in [facility] Integrated Behavioral Health from 12/27/2022 in [facility] Care Integrated Behavioral Health from 12/14/2022 in [facility] Care Office Visit from 12/04/2022 in [facility]  Total GAD-7 Score 4 3 3 2 5       PHQ2-9    Flowsheet Row Integrated Behavioral Health from 05/17/2023 in [facility] Office Visit from 02/08/2023 in [facility] Care Integrated Behavioral Health from 12/27/2022 in [facility] Primary Care Integrated Behavioral Health from 12/14/2022 in [facility] Care Office Visit from 12/04/2022 in [facility] Primary Care  PHQ-2 Total Score 1 1 1 1 2   PHQ-9 Total Score 4 3 9 9 13       Flowsheet Row ED from 06/27/2023 in [facility] ED from 02/18/2023 in [facility] at [facility] ED from 10/14/2021 in [facility] at [place]  C-SSRS RISK CATEGORY No Risk No Risk No Risk        Assessment and Plan:  Faith Allen is a 37 y.o. year old female with a history of ADHD, depression, who presents for follow up appointment for below.    1.  Attention deficit hyperactivity disorder (ADHD), predominantly  inattentive type -She was on ADHD medication when she was in elementary school. Although formal neuropsychological testing has not been conducted, she does have a history of childhood ADHD, and the clinical course is consistent with ADHD.  UDS negative 01/2023  She had an adverse reaction of jitteriness from Adderall XR.  She continues to have difficulty in attention, although she has been able to manage daily tasks.  Given she is planning to leave after online school, will start Vyvanse to target ADHD.  Discussed potential risk of jitteriness, palpitation and insomnia.   2. Major depressive disorder with single episode, in full remission [facility]) Although she denies any sadness, she has occasional mood, which she is unsure if she is depressed.  Will continue to assess.  She is willing to continue to see [facility] specialist for therapy.   # Binge eating  New symptoms.  She reports binge eating in relation to stress.  Will start Vyvanse as outlined above.    Plan Discontinue Adderall XR Start Vyvanse 10 mg daily  Next appointment- 2/7 at 8 40 for 20 mins, video Father cell phone-  [phone], **[email or web address]   Past trials- Focalin, Adderall XR /jitteriness at 10 mg   The patient demonstrates the following risk factors for suicide: Chronic risk factors for suicide include: psychiatric disorder of none. Acute risk factors for suicide include: unemployment. Protective factors for this patient include: responsibility to others (children, family) and hope for the future. Considering these factors, the overall suicide risk at this point appears to be low. Patient is appropriate for outpatient follow up.       Collaboration of Care: Collaboration of Care: Other reviewed notes in Epic  Patient/Guardian was advised Release of Information must be obtained prior to any record release in order to collaborate their care with an outside provider.  Patient/Guardian was advised if they have not already done so to contact the registration department to sign all necessary forms in order for [place] to release information regarding their care.   Consent: Patient/Guardian gives verbal consent for treatment and assignment of benefits for services provided during this visit. Patient/Guardian expressed understanding and agreed to proceed.    Neysa Hotter, MD 08/08/2023, 8:41 AM

## 2023-08-08 ENCOUNTER — Telehealth: Payer: Medicaid Other | Admitting: Psychiatry

## 2023-08-08 ENCOUNTER — Encounter: Payer: Self-pay | Admitting: Psychiatry

## 2023-08-08 DIAGNOSIS — F325 Major depressive disorder, single episode, in full remission: Secondary | ICD-10-CM

## 2023-08-08 DIAGNOSIS — R632 Polyphagia: Secondary | ICD-10-CM

## 2023-08-08 DIAGNOSIS — F900 Attention-deficit hyperactivity disorder, predominantly inattentive type: Secondary | ICD-10-CM | POA: Diagnosis not present

## 2023-08-08 MED ORDER — LISDEXAMFETAMINE DIMESYLATE 10 MG PO CAPS
10.0000 mg | ORAL_CAPSULE | Freq: Every day | ORAL | 0 refills | Status: DC
Start: 2023-10-07 — End: 2023-10-11

## 2023-08-08 MED ORDER — LISDEXAMFETAMINE DIMESYLATE 10 MG PO CAPS: 10.0000 mg | ORAL_CAPSULE | Freq: Every day | ORAL | 0 refills | Status: DC

## 2023-08-11 NOTE — Patient Instructions (Signed)

## 2023-08-11 NOTE — Progress Notes (Unsigned)
   Established Patient Office Visit   Subjective  [name], female    DOB: [birth year]  Age: 37 y.o. MRN: [id number]  No chief complaint on file.   She  has a past medical history of Anemia, Attention deficit disorder, and Medical history non-contributory.  HPI  ROS    Objective:     There were no vitals taken for this visit. {Vitals History (Optional):23777}  Physical Exam   No results found for any visits on 08/12/23.  The ASCVD Risk score (Arnett DK, et al., 2019) failed to calculate for the following reasons:   The 2019 ASCVD risk score is only valid for ages 16 to 31    Assessment & Plan:  There are no diagnoses linked to this encounter.  No follow-ups on file.   [name] Newman Nip, FNP

## 2023-08-12 ENCOUNTER — Encounter (INDEPENDENT_AMBULATORY_CARE_PROVIDER_SITE_OTHER): Payer: No Typology Code available for payment source | Admitting: Family Medicine

## 2023-08-12 DIAGNOSIS — R7303 Prediabetes: Secondary | ICD-10-CM

## 2023-08-12 NOTE — Progress Notes (Signed)
NO SHOW

## 2023-08-15 ENCOUNTER — Encounter: Payer: Self-pay | Admitting: Family Medicine

## 2023-10-05 NOTE — Progress Notes (Signed)
 Virtual Visit via Video Note  I connected with Faith Allen on 10/11/23 at  8:40 AM EST by a video enabled telemedicine application and verified that I am speaking with the correct person using two identifiers.  Location: Patient: home Provider: office Persons participated in the visit- patient, provider    I discussed the limitations of evaluation and management by telemedicine and the availability of in person appointments. The patient expressed understanding and agreed to proceed.    I discussed the assessment and treatment plan with the patient. The patient was provided an opportunity to ask questions and all were answered. The patient agreed with the plan and demonstrated an understanding of the instructions.   The patient was advised to call back or seek an in-person evaluation if the symptoms worsen or if the condition fails to improve as anticipated.   Katheren Sleet, MD    [facility] MD/PA/NP OP Progress Note  10/11/2023 9:05 AM Faith Allen  MRN:  [id number]  Chief Complaint:  Chief Complaint  Patient presents with   Follow-up   HPI:  This is a follow-up appointment for ADHD.  She states that she feels tired.  Her children have been sick with stomach virus and flu.  Her husband is busy at work as a copy, although he is supported.  She does not have much support, although his mother-in-law might visit at times.  She tends to push of things ununited to her children, including mails, bills and insurance.  She has difficulty in de cluttering.  She is just trying to survive.  She feels overwhelmed with this, although she does not necessarily feel depressed, stating that she may have too much pride for it.  She has worsening in insomnia.  She has significant fatigue, and has started to take vitamin D .  She denies change in appetite.  She denies SI.  She found no difference since starting Vyvanse .  She denies any jitteriness either. She denies alcohol use, or drug use.  She agrees  with the plan as outlined below.   Substance use   Tobacco Alcohol Other substances/  Current denies Quit in Jan 2024 (used to drink a glass of wine a few times per week) Denies, one coffee  Past denies Maximum-3 glasses of wine at dinner Marijuana BID until 20's, quit in 2022  Past Treatment            Daily routine: spends time with her husband and children in the morning. She goes to park or visits her father until her husband return from work Employment: works at [employer]: husband and four children Marital status: married since 1990 Number of children: 4, age 30, 47, 2, 3 Education: one year college of Early childhood education. She received certificate, She has GED; she stopped going to school at 12 th grade. Although she does not remember the reason, she states that she wanted to hang out with her friends instead.  She moved to [place] at age 33  Visit Diagnosis:    ICD-10-CM   1. Attention deficit hyperactivity disorder (ADHD), predominantly inattentive type  F90.0     2. Fatigue, unspecified type  R53.83 VITAMIN D  25 Hydroxy (Vit-D Deficiency, Fractures)    CBC    Iron, TIBC and Ferritin Panel    TSH    3. Vitamin D  deficiency  E55.9 VITAMIN D  25 Hydroxy (Vit-D Deficiency, Fractures)    4. Iron deficiency  E61.1 Iron, TIBC and Ferritin Panel  Past Psychiatric History: Please see initial evaluation for full details. I have reviewed the history. No updates at this time.     Past Medical History:  Past Medical History:  Diagnosis Date   Anemia    Attention deficit disorder    Medical history non-contributory     Past Surgical History:  Procedure Laterality Date   NO PAST SURGERIES     TUBAL LIGATION Bilateral 10/07/2016   Procedure: POST PARTUM TUBAL LIGATION;  Surgeon: Vonn VEAR Inch, MD;  Location: [facility];  Service: Gynecology;  Laterality: Bilateral;    Family Psychiatric History: Please see initial evaluation for full details. I have reviewed the  history. No updates at this time.     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  Tobacco Use   Smoking status: Former    Current packs/day: 0.00    Types: Cigarettes    Start date: 07/04/2006    Quit date: 07/05/2007    Years since quitting: 16.2   Smokeless tobacco: Never   Tobacco comments:    Would smoke socially  Vaping Use   Vaping status: Former  Substance and Sexual Activity   Alcohol use: Yes    Alcohol/week: 3.0 standard drinks of alcohol    Types: 2 Glasses of wine, 1 Shots of liquor per week    Comment: Once a month   Drug use: No   Sexual activity: Not Currently    Birth control/protection: Surgical    Comment: tubal  Other Topics Concern   Not on file  Social History Narrative   Not on file   Social Drivers of Health   Financial Resource Strain: Not on file  Food Insecurity: Not on file  Transportation Needs: Not on file  Physical Activity: Not on file  Stress: Not on file  Social Connections: Not on file    Allergies: No Known Allergies  Metabolic Disorder Labs: Lab Results  Component Value Date   HGBA1C 5.8 (H) 12/04/2022   MPG 108 08/03/2019   No results found for: PROLACTIN Lab Results  Component Value Date   CHOL 125 12/04/2022   TRIG 78 12/04/2022   HDL 44 12/04/2022   CHOLHDL 2.8 12/04/2022   LDLCALC 65 12/04/2022   LDLCALC 80 08/03/2019   Lab Results  Component Value Date   TSH 1.510 12/04/2022   TSH  0.89 07/27/2020    Therapeutic Level Labs: No results found for: LITHIUM No results found for: VALPROATE No results found for: CBMZ  Current Medications: Current Outpatient Medications  Medication Sig Dispense Refill   lisdexamfetamine  (VYVANSE ) 30 MG capsule Take 1 capsule (30 mg total) by mouth daily before breakfast. 30 capsule 0   [START ON 11/10/2023] lisdexamfetamine  (VYVANSE ) 30 MG capsule Take 1 capsule (30 mg total) by mouth daily before breakfast. 30 capsule 0   albuterol  (VENTOLIN  HFA) 108 (90  Base) MCG/ACT inhaler Inhale 2 puffs into the lungs every 4 (four) hours as needed for wheezing or shortness of breath. 18 g 0   Cholecalciferol 1.25 MG (50000 UT) TABS Take 5,000 Int'l Units by mouth.     nortriptyline  (PAMELOR ) 10 MG capsule Take 1 capsule (10 mg total) by mouth at bedtime. (Patient not taking: Reported on 02/22/2023) 30 capsule 3   Probiotic Product (FORTIFY PROBIOTIC WOMENS PO) Take by mouth. (Patient not taking: Reported on 02/08/2023)     promethazine -dextromethorphan (PROMETHAZINE -DM) 6.25-15 MG/5ML syrup Take 5 mLs by mouth 4 (four) times daily as needed. 100 mL 0   triamcinolone  (KENALOG ) 0.025 % ointment Apply 1 Application topically 2 (two) times daily. Apply sparingly to skin up to 2 times daily as needed and leave open to air 15 g 0   No current facility-administered medications for this visit.     Musculoskeletal: Strength & Muscle Tone:  N/A Gait & Station:  N/A Patient leans: N/A  Psychiatric Specialty Exam: Review of Systems  Psychiatric/Behavioral:  Positive for decreased concentration and sleep disturbance. Negative for agitation, behavioral problems, confusion, dysphoric mood, hallucinations, self-injury and suicidal ideas. The patient is not nervous/anxious and is not hyperactive.   All other systems reviewed and are negative.   There were no vitals taken for this visit.There is no height or weight on file to calculate BMI.  General  Appearance: Well Groomed  Eye Contact:  Good  Speech:  Clear and Coherent  Volume:  Normal  Mood:   tired  Affect:  Appropriate, Congruent, and Full Range  Thought Process:  Coherent  Orientation:  Full (Time, Place, and Person)  Thought Content: Logical   Suicidal Thoughts:  No  Homicidal Thoughts:  No  Memory:  Immediate;   Good  Judgement:  Good  Insight:  Good  Psychomotor Activity:  Normal  Concentration:  Concentration: Good and Attention Span: Good  Recall:  Good  Fund of Knowledge: Good  Language: Good  Akathisia:  No  Handed:  Right  AIMS (if indicated): not done  Assets:  Communication Skills Desire for Improvement  ADL's:  Intact  Cognition: WNL  Sleep:  Poor   Screenings: GAD-7    Flowsheet Row Integrated Behavioral Health from 05/17/2023 in [facility] Office Visit from 02/08/2023 in [facility] Integrated Behavioral Health from 12/27/2022 in [facility] Integrated Behavioral Health from 12/14/2022 in [facility] Office Visit from 12/04/2022 in [facility]  Total GAD-7 Score 4 3 3 2 5       PHQ2-9    Flowsheet Row Integrated Behavioral Health from 05/17/2023 in [facility] Office Visit from 02/08/2023 in [facility] Integrated Behavioral Health from 12/27/2022 in [facility] Integrated Behavioral Health from 12/14/2022 in [facility] Office Visit from 12/04/2022 in [facility]  PHQ-2 Total Score 1 1 1 1 2   PHQ-9 Total Score 4 3 9 9 13       Flowsheet Row ED from 06/27/2023 in [facility] ED from 02/18/2023 in [facility] at [facility] ED from 10/14/2021 in [facility] at [place]  C-SSRS RISK CATEGORY No Risk No Risk No Risk        Assessment and Plan:  Faith Allen is a 38 y.o. year  old female with a history of ADHD, depression,  who presents for follow up appointment for below.   1. Attention deficit hyperactivity disorder (ADHD), predominantly inattentive type -She was on ADHD medication when she was in elementary school. Although formal neuropsychological testing has not been conducted, she does have a history of childhood ADHD, and the clinical course is consistent with ADHD.  UDS negative 01/2023  Worsening in organization and procrastination.  She denies any side effect from Vyvanse , although she had jitteriness from other stimulant.  Will uptitrate Vyvanse  to optimize treatment for ADHD.   2. Fatigue, unspecified type 3. Vitamin D  deficiency 4. Iron deficiency Significant worsening.  Will obtain labs to rule out medical health issues contributing to this.     2. Major depressive disorder with single episode, in full remission [facility]) Although she denies any sadness, she has occasional mood, which she is unsure if she is depressed.  Will continue to assess.  Although she is willing to continue to see [facility] specialist for therapy, she would like to hold it for now due to issues with insurance.   Plan Increase Vyvanse  30 mg daily  Obtain lab- vitamin D , CBC, iron panels, TSH Next appointment- 4/4 at 8 am for 20 mins, video Father cell phone-  [phone], **[email or web address]   Past trials- Focalin , Adderall XR /jitteriness at 10 mg   The patient demonstrates the following risk factors for suicide: Chronic risk factors for suicide include: psychiatric disorder of none. Acute risk factors for suicide include: unemployment. Protective factors for this patient include: responsibility to others (children, family) and hope for the future. Considering these factors, the overall suicide risk at this point appears to be low. Patient is appropriate for outpatient follow up.    Collaboration of Care: Collaboration of Care: Other reviewed notes in Epic  Patient/Guardian was  advised Release of Information must be obtained prior to any record release in order to collaborate their care with an outside provider. Patient/Guardian was advised if they have not already done so to contact the registration department to sign all necessary forms in order for us  to release information regarding their care.   Consent: Patient/Guardian gives verbal consent for treatment and assignment of benefits for services provided during this visit. Patient/Guardian expressed understanding and agreed to proceed.    Katheren Sleet, MD 10/11/2023, 9:05 AM

## 2023-10-11 ENCOUNTER — Telehealth (INDEPENDENT_AMBULATORY_CARE_PROVIDER_SITE_OTHER): Payer: Medicaid Other | Admitting: Psychiatry

## 2023-10-11 ENCOUNTER — Encounter: Payer: Self-pay | Admitting: Psychiatry

## 2023-10-11 DIAGNOSIS — E559 Vitamin D deficiency, unspecified: Secondary | ICD-10-CM

## 2023-10-11 DIAGNOSIS — F900 Attention-deficit hyperactivity disorder, predominantly inattentive type: Secondary | ICD-10-CM | POA: Diagnosis not present

## 2023-10-11 DIAGNOSIS — E611 Iron deficiency: Secondary | ICD-10-CM

## 2023-10-11 DIAGNOSIS — R5383 Other fatigue: Secondary | ICD-10-CM

## 2023-10-11 MED ORDER — LISDEXAMFETAMINE DIMESYLATE 30 MG PO CAPS: 30.0000 mg | ORAL_CAPSULE | Freq: Every day | ORAL | 0 refills | Status: AC

## 2023-10-11 MED ORDER — LISDEXAMFETAMINE DIMESYLATE 30 MG PO CAPS
30.0000 mg | ORAL_CAPSULE | Freq: Every day | ORAL | 0 refills | Status: AC
Start: 2023-11-10 — End: 2023-12-10

## 2023-10-11 NOTE — Patient Instructions (Signed)
 Increase Vyvanse  30 mg daily  Obtain lab- vitamin D , CBC, iron panels, TSH Next appointment- 4/4 at 8 am

## 2023-11-30 NOTE — Progress Notes (Unsigned)
 Virtual Visit via Video Note  I connected with Faith Allen on 12/06/23 at  8:00 AM EDT by a video enabled telemedicine application and verified that I am speaking with the correct person using two identifiers.  Location: Patient: home Provider: office Persons participated in the visit- patient, provider    I discussed the limitations of evaluation and management by telemedicine and the availability of in person appointments. The patient expressed understanding and agreed to proceed.   I discussed the assessment and treatment plan with the patient. The patient was provided an opportunity to ask questions and all were answered. The patient agreed with the plan and demonstrated an understanding of the instructions.   The patient was advised to call back or seek an in-person evaluation if the symptoms worsen or if the condition fails to improve as anticipated.   Faith Hotter, MD    [facility] MD/PA/NP OP Progress Note  12/06/2023 9:56 AM Faith Allen  MRN:  [id number]  Chief Complaint:  Chief Complaint  Patient presents with   Follow-up   HPI:  This is a follow-up appointment for ADHD, fatigue and depression.  She states that although Vyvanse is helping, it has not made a big difference.  She might have a little more focus.  However, she has noticed that she occasionally has difficulty in the insomnia, and feels like she takes caffeine.  She usually takes medication 5-7 am. When she takes it around 8 am, it causes insomnia.  She has been taking the medication infrequently due to this concern.  Although she has been trying to make a presentable at home, she has struggles with paperwork and bills.  She also feels a little in between of empty nester, referring to her oldest kids, going to high school.  Her husband has been more supportive, and she reports better relationship.  She denies change in appetite.  She denies anxiety. She continues to feel fatigue. She enjoyed going to a trail. She denies  SI.  She prefers to stay on the same medication regimen to see how she does until her next visit.    Substance use   Tobacco Alcohol Other substances/  Current denies Quit in Jan 2024 (used to drink a glass of wine a few times per week) Denies, one coffee  Past denies Maximum-3 glasses of wine at dinner Marijuana BID until 20's, quit in 2022  Past Treatment            Daily routine: spends time with her husband and children in the morning. She goes to park or visits her father until her husband return from work Employment: works at [employer]: husband and four children Marital status: married since 1990 Number of children: 4, age 28, 8, 107, 3 Education: one year college of Early childhood education. She received certificate, She has GED; she stopped going to school at 12 th grade. Although she does not remember the reason, she states that she wanted to hang out with her friends instead.  She moved to [place] at age 40   Visit Diagnosis:    ICD-10-CM   1. Attention deficit hyperactivity disorder (ADHD), predominantly inattentive type  F90.0     2. Fatigue, unspecified type  R53.83     3. Vitamin D deficiency  E55.9     4. Iron deficiency  E61.1     5. Major depressive disorder with single episode, in full remission (HCC)  F32.5       Past Psychiatric History: Please  see initial evaluation for full details. I have reviewed the history. No updates at this time.     Past Medical History:  Past Medical History:  Diagnosis Date   Anemia    Attention deficit disorder    Medical history non-contributory     Past Surgical History:  Procedure Laterality Date   NO PAST SURGERIES     TUBAL LIGATION Bilateral 10/07/2016   Procedure: POST PARTUM TUBAL LIGATION;  Surgeon: Lazaro Arms, MD;  Location: [facility];  Service: Gynecology;  Laterality: Bilateral;    Family Psychiatric History: Please see initial evaluation for full details. I have reviewed the history. No updates at this  time.     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  Tobacco Use   Smoking status: Former    Current packs/day: 0.00    Types: Cigarettes    Start date: 07/04/2006    Quit date: 07/05/2007    Years since quitting: 16.4   Smokeless tobacco: Never   Tobacco comments:    Would smoke socially  Vaping Use   Vaping status: Former  Substance and Sexual Activity   Alcohol use: Yes    Alcohol/week: 3.0 standard drinks of alcohol    Types: 2 Glasses of wine, 1 Shots of liquor per week    Comment: Once a month   Drug use: No   Sexual activity: Not Currently    Birth control/protection: Surgical    Comment: tubal  Other Topics Concern   Not on file  Social History Narrative   Not on file   Social Drivers of Health   Financial Resource Strain: Not on file  Food Insecurity: Not on file  Transportation Needs: Not on file  Physical Activity: Not on file  Stress: Not on file  Social Connections: Not on file    Allergies: No Known Allergies  Metabolic Disorder Labs: Lab Results  Component Value Date   HGBA1C 5.8 (H) 12/04/2022   MPG 108 08/03/2019   No results found for: "PROLACTIN" Lab Results  Component Value Date   CHOL 125 12/04/2022   TRIG 78 12/04/2022   HDL 44 12/04/2022   CHOLHDL 2.8 12/04/2022   LDLCALC 65 12/04/2022   LDLCALC 80 08/03/2019   Lab Results  Component Value Date   TSH 1.510 12/04/2022   TSH 0.89 07/27/2020     Therapeutic Level Labs: No results found for: "LITHIUM" No results found for: "VALPROATE" No results found for: "CBMZ"  Current Medications: Current Outpatient Medications  Medication Sig Dispense Refill   albuterol (VENTOLIN HFA) 108 (90 Base) MCG/ACT inhaler Inhale 2 puffs into the lungs every 4 (four) hours as needed for wheezing or shortness of breath. 18 g 0   Cholecalciferol 1.25 MG (50000 UT) TABS Take 5,000 Int'l Units by mouth.     lisdexamfetamine (VYVANSE) 30 MG capsule Take 1  capsule (30 mg total) by mouth daily before breakfast. 30 capsule 0   lisdexamfetamine (VYVANSE) 30 MG capsule Take 1 capsule (30 mg total) by mouth daily before breakfast. 30 capsule 0   nortriptyline (PAMELOR) 10 MG capsule Take 1 capsule (10 mg total) by mouth at bedtime. (Patient not taking: Reported on 02/22/2023) 30 capsule 3   Probiotic Product (FORTIFY PROBIOTIC WOMENS PO) Take by mouth. (Patient not taking: Reported on 02/08/2023)     promethazine-dextromethorphan (PROMETHAZINE-DM) 6.25-15 MG/5ML syrup Take 5 mLs by mouth 4 (four) times daily as needed. 100 mL 0   triamcinolone (KENALOG) 0.025 % ointment Apply 1 Application topically 2 (two) times daily. Apply sparingly to skin up to 2 times daily as needed and leave open to air 15 g 0   No current facility-administered medications for this visit.     Musculoskeletal: Strength & Muscle Tone:  N/A Gait & Station:  N/A Patient leans: N/A  Psychiatric Specialty Exam: Review of Systems  Psychiatric/Behavioral:  Positive for decreased concentration. Negative for agitation, behavioral problems, confusion, dysphoric mood, hallucinations, self-injury, sleep disturbance and suicidal ideas. The patient is not nervous/anxious and is not hyperactive.   All other systems reviewed and are negative.   There were no vitals taken for this visit.There is no height or weight on file to calculate BMI.  General Appearance: Well Groomed  Eye Contact:  Good   Speech:  Clear and Coherent  Volume:  Normal  Mood:   good  Affect:  Appropriate, Congruent, and Full Range  Thought Process:  Coherent  Orientation:  Full (Time, Place, and Person)  Thought Content: Logical   Suicidal Thoughts:  No  Homicidal Thoughts:  No  Memory:  Immediate;   Good  Judgement:  Good  Insight:  Good  Psychomotor Activity:  Normal  Concentration:  Concentration: Good and Attention Span: Good  Recall:  Good  Fund of Knowledge: Good  Language: Good  Akathisia:  No  Handed:  Right  AIMS (if indicated): not done  Assets:  Communication Skills Desire for Improvement  ADL's:  Intact  Cognition: WNL  Sleep:  Fair   Screenings: GAD-7    Flowsheet Row Integrated Behavioral Health from 05/17/2023 in [facility] Care Office Visit from 02/08/2023 in [facility] Care Integrated Behavioral Health from 12/27/2022 in [facility] Primary Care Integrated Behavioral Health from 12/14/2022 in [facility] Primary Care Office Visit from 12/04/2022 in [facility] Primary Care  Total GAD-7 Score 4 3 3 2 5       PHQ2-9    Flowsheet Row Integrated Behavioral Health from 05/17/2023 in [facility] Office Visit from 02/08/2023 in [facility] Care Integrated Behavioral Health from 12/27/2022 in [facility] Primary Care Integrated Behavioral Health from 12/14/2022 in [facility] Primary Care Office Visit from 12/04/2022 in [facility] Care  PHQ-2 Total Score 1 1 1 1 2   PHQ-9 Total Score 4 3 9 9 13       Flowsheet Row ED from 06/27/2023 in [facility] ED from 02/18/2023 in [facility] at [facility] ED from 10/14/2021 in [facility] [place]  C-SSRS RISK CATEGORY No Risk No Risk No Risk        Assessment and Plan:  Faith Allen is a 38 y.o. year old female with a history of ADHD, depression,  who presents for follow up appointment for below.  1. Attention deficit hyperactivity disorder (ADHD), predominantly inattentive type -She was on ADHD medication when she was in elementary school. Although formal neuropsychological testing has not been conducted, she does have a history of childhood ADHD, and the clinical course is consistent with ADHD.  UDS negative 01/2023  Unstable.  Although she reports slight improvement in concentration since taking the current dose of Vyvanse, she experiences adverse reaction of feeling uncomfortable, insomnia, and she still struggles with ADHD symptoms.  Although it was discussed to consider adjustment of her medication, she would like to stay on the current dose for now until the next visit.  Will continue current dose of Vyvanse to target ADHD.   2. Fatigue, unspecified type 3. Vitamin D deficiency 4. Iron deficiency Slightly improving.  She was advised to obtain labs to rule out medical health issues contributing to her symptoms.   5. Major depressive disorder with single episode, in full remission (HCC) Overall stable.  Will continue to assess and intervene as needed.    Plan Continue Vyvanse 30 mg daily  Obtain lab- vitamin D, CBC, iron panels, TSH Next appointment- 4/4 at 8 am for 20 mins, video Father cell phone-  [phone], **[email or web address]   Past trials- Focalin, Adderall XR /jitteriness at 10 mg   The patient demonstrates the following risk factors for suicide: Chronic risk factors for suicide include: psychiatric disorder of none. Acute risk factors for suicide include: unemployment. Protective factors for this patient include: responsibility to others (children, family) and hope for the future. Considering these factors, the overall suicide risk at this point appears to be low. Patient is appropriate for outpatient follow up.      Collaboration of Care: Collaboration of Care: Other reviewed notes in Epic  Patient/Guardian was  advised Release of Information must be obtained prior to any record release in order to collaborate their care with an outside provider. Patient/Guardian was advised if they have not already done so to contact the registration department to sign all necessary forms in order for [place] to release information regarding their care.   Consent: Patient/Guardian gives verbal consent for treatment and assignment of benefits for services provided during this visit. Patient/Guardian expressed understanding and agreed to proceed.    Faith Hotter, MD 12/06/2023, 9:56 AM

## 2023-12-06 ENCOUNTER — Telehealth: Payer: Medicaid Other | Admitting: Psychiatry

## 2023-12-06 ENCOUNTER — Encounter: Payer: Self-pay | Admitting: Psychiatry

## 2023-12-06 DIAGNOSIS — R5383 Other fatigue: Secondary | ICD-10-CM | POA: Diagnosis not present

## 2023-12-06 DIAGNOSIS — F900 Attention-deficit hyperactivity disorder, predominantly inattentive type: Secondary | ICD-10-CM | POA: Diagnosis not present

## 2023-12-06 DIAGNOSIS — E611 Iron deficiency: Secondary | ICD-10-CM | POA: Diagnosis not present

## 2023-12-06 DIAGNOSIS — F325 Major depressive disorder, single episode, in full remission: Secondary | ICD-10-CM

## 2023-12-06 DIAGNOSIS — E559 Vitamin D deficiency, unspecified: Secondary | ICD-10-CM

## 2024-01-25 NOTE — Progress Notes (Deleted)
 [facility] MD/PA/NP OP Progress Note  01/25/2024 4:08 PM Faith Allen  MRN:  [id number]  Chief Complaint: No chief complaint on file.  HPI: ***   Substance use   Tobacco Alcohol Other substances/  Current denies Quit in Jan 2024 (used to drink a glass of wine a few times per week) Denies, one coffee  Past denies Maximum-3 glasses of wine at dinner Marijuana BID until 20's, quit in 2022  Past Treatment            Daily routine: spends time with her husband and children in the morning. She goes to park or visits her father until her husband return from work Employment: works at [employer]: husband and four children Marital status: married since 1990 Number of children: 4, age 18, 67, 59, 3 Education: one year college of Early childhood education. She received certificate, She has GED; she stopped going to school at 12 th grade. Although she does not remember the reason, she states that she wanted to hang out with her friends instead.  She moved to [place] at age 42  Visit Diagnosis: No diagnosis found.  Past Psychiatric History: Please see initial evaluation for full details. I have reviewed the history. No updates at this time.     Past Medical History:  Past Medical History:  Diagnosis Date   Anemia    Attention deficit disorder    Medical history non-contributory     Past Surgical History:  Procedure Laterality Date   NO PAST SURGERIES     TUBAL LIGATION Bilateral 10/07/2016   Procedure: POST PARTUM TUBAL LIGATION;  Surgeon: Wendelyn Halter, MD;  Location: [facility];  Service: Gynecology;  Laterality: Bilateral;    Family Psychiatric History: Please see initial evaluation for full details. I have reviewed the history. No updates at this time.     Family History:  Family History  Problem Relation Age of Onset   Diabetes Mother    Asthma Mother    Miscarriages / Stillbirths Sister    Irritable bowel syndrome Sister    Irritable bowel syndrome Sister    Anxiety disorder Sister     Inflammatory bowel disease Neg Hx    Colon cancer Neg Hx     Social History:  Social History   Socioeconomic History   Marital status: Married    Spouse name: Not on file   Number of children: 4   Years of education: Not on file   Highest education level: Some college, no degree  Occupational History   Occupation: Stay-at-home  Tobacco Use   Smoking status: Former    Current packs/day: 0.00    Types: Cigarettes    Start date: 07/04/2006    Quit date: 07/05/2007    Years since quitting: 16.5   Smokeless tobacco: Never   Tobacco comments:    Would smoke socially  Vaping Use   Vaping status: Former  Substance and Sexual Activity   Alcohol use: Yes    Alcohol/week: 3.0 standard drinks of alcohol    Types: 2 Glasses of wine, 1 Shots of liquor per week    Comment: Once a month   Drug use: No   Sexual activity: Not Currently    Birth control/protection: Surgical    Comment: tubal  Other Topics Concern   Not on file  Social History Narrative   Not on file   Social Drivers of Health   Financial Resource Strain: Not on file  Food Insecurity: Not on file  Transportation  Needs: Not on file  Physical Activity: Not on file  Stress: Not on file  Social Connections: Not on file    Allergies: No Known Allergies  Metabolic Disorder Labs: Lab Results  Component Value Date   HGBA1C 5.8 (H) 12/04/2022   MPG 108 08/03/2019   No results found for: "PROLACTIN" Lab Results  Component Value Date   CHOL 125 12/04/2022   TRIG 78 12/04/2022   HDL 44 12/04/2022   CHOLHDL 2.8 12/04/2022   LDLCALC 65 12/04/2022   LDLCALC 80 08/03/2019   Lab Results  Component Value Date   TSH 1.510 12/04/2022   TSH 0.89 07/27/2020    Therapeutic Level Labs: No results found for: "LITHIUM" No results found for: "VALPROATE" No results found for: "CBMZ"  Current Medications: Current Outpatient Medications  Medication Sig Dispense Refill   albuterol  (VENTOLIN  HFA) 108 (90 Base) MCG/ACT  inhaler Inhale 2 puffs into the lungs every 4 (four) hours as needed for wheezing or shortness of breath. 18 g 0   Cholecalciferol 1.25 MG (50000 UT) TABS Take 5,000 Int'l Units by mouth.     lisdexamfetamine (VYVANSE ) 30 MG capsule Take 1 capsule (30 mg total) by mouth daily before breakfast. 30 capsule 0   lisdexamfetamine (VYVANSE ) 30 MG capsule Take 1 capsule (30 mg total) by mouth daily before breakfast. 30 capsule 0   nortriptyline  (PAMELOR ) 10 MG capsule Take 1 capsule (10 mg total) by mouth at bedtime. (Patient not taking: Reported on 02/22/2023) 30 capsule 3   Probiotic Product (FORTIFY PROBIOTIC WOMENS PO) Take by mouth. (Patient not taking: Reported on 02/08/2023)     promethazine -dextromethorphan (PROMETHAZINE -DM) 6.25-15 MG/5ML syrup Take 5 mLs by mouth 4 (four) times daily as needed. 100 mL 0   triamcinolone  (KENALOG ) 0.025 % ointment Apply 1 Application topically 2 (two) times daily. Apply sparingly to skin up to 2 times daily as needed and leave open to air 15 g 0   No current facility-administered medications for this visit.     Musculoskeletal: Strength & Muscle Tone: N/A Gait & Station: N/A Patient leans: N/A  Psychiatric Specialty Exam: Review of Systems  There were no vitals taken for this visit.There is no height or weight on file to calculate BMI.  General Appearance: {Appearance:22683}  Eye Contact:  {BHH EYE CONTACT:22684}  Speech:  Clear and Coherent  Volume:  Normal  Mood:  {BHH MOOD:22306}  Affect:  {Affect (PAA):22687}  Thought Process:  Coherent  Orientation:  Full (Time, Place, and Person)  Thought Content: Logical   Suicidal Thoughts:  {ST/HT (PAA):22692}  Homicidal Thoughts:  {ST/HT (PAA):22692}  Memory:  Immediate;   Good  Judgement:  {Judgement (PAA):22694}  Insight:  {Insight (PAA):22695}  Psychomotor Activity:  Normal  Concentration:  Concentration: Good and Attention Span: Good  Recall:  Good  Fund of Knowledge: Good  Language: Good   Akathisia:  No  Handed:  Right  AIMS (if indicated): not done  Assets:  Communication Skills Desire for Improvement  ADL's:  Intact  Cognition: WNL  Sleep:  {BHH GOOD/FAIR/POOR:22877}   Screenings: GAD-7    Psychologist, occupational Health from 05/17/2023 in [facility] Office Visit from 02/08/2023 in [facility] Primary Care Integrated Behavioral Health from 12/27/2022 in [facility] Primary Care Integrated Behavioral Health from 12/14/2022 in [facility] Care Office Visit from 12/04/2022 in [facility] Primary Care  Total GAD-7 Score 4 3 3 2 5       PHQ2-9  Flowsheet Row Integrated Behavioral Health from 05/17/2023 in [facility] Care Office Visit from 02/08/2023 in [facility] Primary Care Integrated Behavioral Health from 12/27/2022 in [facility] Care Integrated Behavioral Health from 12/14/2022 in [facility] Primary Care Office Visit from 12/04/2022 in [facility] Primary Care  PHQ-2 Total Score 1 1 1 1 2   PHQ-9 Total Score 4 3 9 9 13       Flowsheet Row UC from 06/27/2023 in [facility] [place] UC from 02/18/2023 in [facility] [place] UC from 10/14/2021 in [facility] at [place]  C-SSRS RISK CATEGORY No Risk No Risk No Risk        Assessment and Plan:  REJOICE HEATWOLE is a 38 y.o. year old female with a history of ADHD, depression, who presents for follow up appointment for below.    1. Attention deficit hyperactivity disorder (ADHD), predominantly inattentive type -She was on ADHD medication when she was in elementary school. Although formal neuropsychological testing has not been conducted, she does have a history of childhood ADHD, and the clinical course is consistent with ADHD.  UDS negative 01/2023  Unstable.  Although she reports slight improvement in concentration since taking  the current dose of Vyvanse , she experiences adverse reaction of feeling uncomfortable, insomnia, and she still struggles with ADHD symptoms.  Although it was discussed to consider adjustment of her medication, she would like to stay on the current dose for now until the next visit.  Will continue current dose of Vyvanse  to target ADHD.    2. Fatigue, unspecified type 3. Vitamin D  deficiency 4. Iron deficiency Slightly improving.  She was advised to obtain labs to rule out medical health issues contributing to her symptoms.    5. Major depressive disorder with single episode, in full remission (HCC) Overall stable.  Will continue to assess and intervene as needed.    Plan Continue Vyvanse  30 mg daily  Obtain lab- vitamin D , CBC, iron panels, TSH Next appointment- 4/4 at 8 am for 20 mins, video Father cell phone-  [phone], **[email or web address]   Past trials- Focalin , Adderall XR /jitteriness at 10 mg   The patient demonstrates the following risk factors for suicide: Chronic risk factors for suicide include: psychiatric disorder of none. Acute risk factors for suicide include: unemployment. Protective factors for this patient include: responsibility to others (children, family) and hope for the future. Considering these factors, the overall suicide risk at this point appears to be low. Patient is appropriate for outpatient follow up.    Collaboration of Care: Collaboration of Care: {BH OP Collaboration of Care:21014065}  Patient/Guardian was advised Release of Information must be obtained prior to any record release in order to collaborate their care with an outside provider. Patient/Guardian was advised if they have not already done so to contact the registration department to sign all necessary forms in order for us  to release information regarding their care.   Consent: Patient/Guardian gives verbal consent for treatment and assignment of benefits for services provided during this  visit. Patient/Guardian expressed understanding and agreed to proceed.    Todd Fossa, MD 01/25/2024, 4:08 PM

## 2024-01-30 ENCOUNTER — Ambulatory Visit: Admitting: Psychiatry
